# Patient Record
Sex: Male | Born: 1985 | Race: White | Hispanic: No | Marital: Single | Smoking: Current every day smoker
Health system: Southern US, Community
[De-identification: ages and names within clinical notes are randomized; demographics above are authoritative.]

## PROBLEM LIST (undated history)

## (undated) DIAGNOSIS — F1010 Alcohol abuse, uncomplicated: Secondary | ICD-10-CM

## (undated) HISTORY — PX: ROTATOR CUFF REPAIR: SHX139

---

## 2006-12-07 ENCOUNTER — Emergency Department (HOSPITAL_COMMUNITY): Admission: EM | Admit: 2006-12-07 | Discharge: 2006-12-08 | Payer: Self-pay | Admitting: Emergency Medicine

## 2008-06-29 ENCOUNTER — Emergency Department (HOSPITAL_BASED_OUTPATIENT_CLINIC_OR_DEPARTMENT_OTHER): Admission: EM | Admit: 2008-06-29 | Discharge: 2008-06-29 | Payer: Self-pay | Admitting: Emergency Medicine

## 2009-07-21 ENCOUNTER — Emergency Department (HOSPITAL_BASED_OUTPATIENT_CLINIC_OR_DEPARTMENT_OTHER)
Admission: EM | Admit: 2009-07-21 | Discharge: 2009-07-21 | Payer: Self-pay | Source: Home / Self Care | Admitting: Emergency Medicine

## 2011-06-01 ENCOUNTER — Encounter (HOSPITAL_COMMUNITY): Payer: Self-pay | Admitting: *Deleted

## 2011-06-01 ENCOUNTER — Emergency Department (HOSPITAL_COMMUNITY)
Admission: EM | Admit: 2011-06-01 | Discharge: 2011-06-02 | Disposition: A | Discharge status: Home / Self Care | Payer: BC Managed Care – PPO | Attending: Emergency Medicine | Admitting: Emergency Medicine

## 2011-06-01 DIAGNOSIS — F17200 Nicotine dependence, unspecified, uncomplicated: Secondary | ICD-10-CM | POA: Insufficient documentation

## 2011-06-01 DIAGNOSIS — F10939 Alcohol use, unspecified with withdrawal, unspecified: Secondary | ICD-10-CM | POA: Insufficient documentation

## 2011-06-01 DIAGNOSIS — F10239 Alcohol dependence with withdrawal, unspecified: Secondary | ICD-10-CM | POA: Insufficient documentation

## 2011-06-01 DIAGNOSIS — F1010 Alcohol abuse, uncomplicated: Secondary | ICD-10-CM

## 2011-06-01 LAB — COMPREHENSIVE METABOLIC PANEL
Albumin: 4.2 g/dL (ref 3.5–5.2)
Alkaline Phosphatase: 94 U/L (ref 39–117)
CO2: 27 mEq/L (ref 19–32)
Calcium: 9.5 mg/dL (ref 8.4–10.5)
Chloride: 100 mEq/L (ref 96–112)
Creatinine, Ser: 0.72 mg/dL (ref 0.50–1.35)
GFR calc Af Amer: >90 mL/min (ref >90)
GFR calc non Af Amer: >90 mL/min (ref >90)
Potassium: 3.4 mEq/L — ABNORMAL LOW (ref 3.5–5.1)
Total Bilirubin: 1 mg/dL (ref 0.3–1.2)
Total Protein: 7.7 g/dL (ref 6.0–8.3)

## 2011-06-01 LAB — RAPID URINE DRUG SCREEN, HOSP PERFORMED
Amphetamines: NOT DETECTED
Barbiturates: NOT DETECTED
Opiates: NOT DETECTED

## 2011-06-01 LAB — ACETAMINOPHEN LEVEL: Acetaminophen (Tylenol), Serum: <15 ug/mL (ref 10–30)

## 2011-06-01 LAB — CBC
Hemoglobin: 14.3 g/dL (ref 13.0–17.0)
Platelets: 246 10*3/uL (ref 150–400)
WBC: 9 10*3/uL (ref 4.0–10.5)

## 2011-06-01 MED ORDER — VITAMIN B-1 100 MG PO TABS
100.0000 mg | ORAL_TABLET | Freq: Every day | ORAL | Status: DC
  Administered 2011-06-01 – 2011-06-02 (×2): 100 mg via ORAL
  Filled 2011-06-01 (×2): qty 1

## 2011-06-01 MED ORDER — LORAZEPAM 2 MG/ML IJ SOLN: 1.0000 mg | Freq: Four times a day (QID) | INTRAMUSCULAR | Status: DC | PRN

## 2011-06-01 MED ORDER — THIAMINE HCL 100 MG/ML IJ SOLN: 100.0000 mg | Freq: Every day | INTRAMUSCULAR | Status: DC

## 2011-06-01 MED ORDER — FOLIC ACID 1 MG PO TABS
1.0000 mg | ORAL_TABLET | Freq: Every day | ORAL | Status: DC
  Administered 2011-06-01 – 2011-06-02 (×2): 1 mg via ORAL
  Filled 2011-06-01 (×2): qty 1

## 2011-06-01 MED ORDER — ADULT MULTIVITAMIN W/MINERALS CH
1.0000 | ORAL_TABLET | Freq: Every day | ORAL | Status: DC
  Administered 2011-06-01 – 2011-06-02 (×2): 1 via ORAL
  Filled 2011-06-01 (×2): qty 1

## 2011-06-01 MED ORDER — LORAZEPAM 1 MG PO TABS: 1.0000 mg | ORAL_TABLET | Freq: Four times a day (QID) | ORAL | Status: DC | PRN

## 2011-06-01 NOTE — ED Provider Notes (Signed)
History     CSN: [id number]  Arrival date & time 06/01/11  1544   First MD Initiated Contact with Patient 06/01/11 1747      Chief Complaint  Patient presents with  . Medical Clearance    pt here for medical clearance, [facility]. reports [facility] with tactile hallucinations. pt here for detox from alcohol. reports last drink was 16 days ago. pt also reports "passing out 4 times since monday"    (Consider location/radiation/quality/duration/timing/severity/associated sxs/prior treatment) HPI Comments: Patient comes in today requesting alcohol detox.  He reports that his last drink was 16 days ago.  He had been drinking a fifth of vodka/day.  Patient and parents report that 2 days go the patient had a two separate seizures.    Seizures were witnessed by family.  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  Back in June of 2012 he was seen at the hospital in [place] and apparently had a CT head, MRI head, EEG, and was evaluated by Neurology.  He reports that it was determined that the seizures were caused by alcohol withdrawal.  He also reports that yesterday he began having visual and tactile hallucinations.  He began seeing worms crawling out of his mouth and reports that he could feels worms moving throughout his body.  He was seen at [facility] earlier today to begin alcohol rehab.  He apparently was told by them that they would not accept him because of the hallucinations that he had experienced.  He reports that he smokes marijuana three-four times a week, but denies any other elicit drug use.   Patient denies any suicidal or homicidal ideation.  Patient's psychiatrist is Dr. Luisa Hart.  Family reports that he has seen the psychiatrist in the past for anger outbursts.  He is currently on Seroquel.  Family also reports that last evening the patient was unable to sleep.  Family also  reports that a couple of weeks ago patient made comments that he "wanted to go be with Molly Maduro."  Family reports that Molly Maduro is a friend of the patient's who has died.  Patient denies current SI.  The history is provided by the patient and a parent.    History reviewed. No pertinent past medical history.  Past Surgical History  Procedure Date  . Rotator cuff repair     History reviewed. No pertinent family history.  History  Substance Use Topics  . Smoking status: Current Everyday Smoker    Types: Cigarettes  . Smokeless tobacco: Not on file  . Alcohol Use: Yes      Review of Systems  Constitutional: Negative for fever and chills.  Eyes: Negative for visual disturbance.  Gastrointestinal: Negative for nausea and vomiting.  Neurological: Negative for dizziness, syncope and headaches.  Psychiatric/Behavioral: Positive for hallucinations and sleep disturbance. Negative for suicidal ideas, confusion, self-injury and agitation. The patient is not nervous/anxious.     Allergies  Review of patient's allergies indicates no known allergies.  Home Medications  No current outpatient prescriptions on file.  BP 151/89  Pulse 86  Temp(Src) 98.8 F (37.1 C) (Oral)  Resp 20  SpO2 100%  Physical Exam  Nursing note and vitals reviewed. Constitutional: He is oriented to person, place, and time. He appears well-developed and well-nourished. No distress.  HENT:  Head: Normocephalic and atraumatic.  Eyes: EOM are normal. Pupils are equal, round, and reactive to light.  Neck: Normal range of motion. Neck supple.  Cardiovascular: Normal rate and normal heart sounds.   Pulmonary/Chest: Effort normal and breath sounds normal. No respiratory distress.  Musculoskeletal: Normal range of motion.  Neurological: He is alert and oriented to person, place, and time.  Skin: Skin is warm and dry. He is not diaphoretic.  Psychiatric: He has a normal mood and affect. His speech is normal and  behavior is normal. Thought content is not paranoid. Cognition and memory are normal. He expresses no homicidal and no suicidal ideation. He expresses no suicidal plans and no homicidal plans.    ED Course  Procedures (including critical care time)  Labs Reviewed  CBC - Abnormal; Notable for the following:    RBC 4.18 (*)    MCH 34.2 (*)    MCHC 36.1 (*)    All other components within normal limits  COMPREHENSIVE METABOLIC PANEL - Abnormal; Notable for the following:    Potassium 3.4 (*)    AST 50 (*)    ALT 57 (*)    All other components within normal limits  URINE RAPID DRUG SCREEN (HOSP PERFORMED) - Abnormal; Notable for the following:    Tetrahydrocannabinol POSITIVE (*)    All other components within normal limits  ETHANOL  ACETAMINOPHEN LEVEL   No results found.   No diagnosis found.  8:06 PM Discussed patient with Dr. Mahala Menghini who will see patient in the ED.  Discussed with Dr. Mahala Menghini after he evaluated patient.  Dr. Mahala Menghini reports that he does not feel that the patient meets criteria to be admitted from a medical stand point.  He recommends consulting psychiatry for further evaluation.     MDM          Magnus Sinning, PA-C 06/01/11 2345

## 2011-06-01 NOTE — ED Notes (Signed)
Pts belongings sent home with family.

## 2011-06-01 NOTE — ED Notes (Signed)
Pt placed in room. Pharmacy tech in to interview pt. Pt  Is calm. States he has been having seizures, and has not had a drink in 16 days. Pt states that he has been seeing "worms crawling out of my mouth but I know other people can't see them". Pt's parents are here with him.

## 2011-06-01 NOTE — ED Notes (Signed)
Pt was given sandwich and drink as requested.  NAD noted.

## 2011-06-01 NOTE — BH Assessment (Signed)
Assessment Note   Shane Macias is an 26 y.o. male. PT PRESENTS REQUESTING DETOX AFTER HE HAD [facility] & FELT HE SHOULD BE ADMITTED FOR DUAL DIAGNOSIS SINCE PT HAD DELUSIONS ON YESTERDAY & SEVERAL DAYS AGO. PT DENIES SUICIDAL OR HOMOCIDAL IDEATION OR ANY CURRENT HALLUCINATIONS. PT SEEMS CALM & IN A PLEASANT SPIRIT. PARENTS ARE VERY CONCERNED & WANT PT TO GET HELP. PT'S LABS WERE CLEARED [facility] HE TESTED POSITIVE FOR THC. PT STATES HE LAST DRINK WAS 1 WEEK AGO & HE EXPERIENCE 3 SEIZURES IN THE PAST  FEW MONTHS. PT WAS INFORMED HE DID NOT MEET CRITERIA FOR DETOX & MEDICAL TEAM HAS ALREADY CLEARED HIM FOR MEDICAL ISSUES. PT WILL BE [facility] & CLINICIAN HAS CALLED [facility] TO INFORM [facility] OF ASSESSMENT & DISPOSITION. EDP WAS ALSO INFORMED OF PLAN WHO ALSO AGREES.  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 No pertinent past medical history. Axis IV: problems related to social environment Axis V: 51-60 moderate symptoms  Past Medical History: History reviewed. No pertinent past medical history.  Past Surgical History  Procedure Date  . Rotator cuff repair     Family History: History reviewed. No pertinent family history.  Social History:  reports that he has been smoking Cigarettes.  He does not have any smokeless tobacco history on file. He reports that he drinks about 24.5 ounces of alcohol per week. He reports that he uses illicit drugs (Marijuana).  Additional Social History:    Allergies: No Known Allergies  Home Medications:  Medications Prior to Admission  Medication Dose Route Frequency Provider Last Rate Last Dose  . folic acid (FOLVITE) tablet 1 mg  1 mg Oral Daily Heather Van Wingen, PA-C      . LORazepam (ATIVAN) tablet 1 mg  1 mg Oral Q6H PRN Pascal Lux Wingen, PA-C       Or  . LORazepam  (ATIVAN) injection 1 mg  1 mg Intravenous Q6H PRN Pascal Lux Wingen, PA-C      . mulitivitamin with minerals tablet 1 tablet  1 tablet Oral Daily Heather Van Wingen, PA-C      . thiamine (VITAMIN B-1) tablet 100 mg  100 mg Oral Daily Heather Van Wingen, PA-C       Or  . thiamine (B-1) injection 100 mg  100 mg Intravenous Daily Pascal Lux Wingen, PA-C       No current outpatient prescriptions on file as of 06/01/2011.    OB/GYN Status:  No LMP for male patient.  General Assessment Data Location of Assessment: [facility] Assessment Services Living Arrangements: Alone Can pt return to current living arrangement?: Yes Admission Status: Voluntary Is patient capable of signing voluntary admission?: Yes Transfer from: Acute Hospital Referral Source: Self/Family/Friend     Risk to self Suicidal Ideation: No Suicidal Intent: No Is patient at risk for suicide?: No Suicidal Plan?: No Access to Means: No What has been your use of drugs/alcohol within the last 12 months?: ETOH STARTED AT 15 & DRANK 5TH VODKA DAILY & LAST DRINK WAS 05/23/11; THC STARTED AT AGE 86 & SMOKES 2-3 BLUNTS DAILY & LAST 05/29/11 Previous Attempts/Gestures: Yes How many times?: 0  Other Self Harm Risks: NA Triggers for Past Attempts: Unpredictable Intentional Self Injurious Behavior: None Family Suicide History: No Recent stressful life event(s): Other (Comment) Persecutory voices/beliefs?: No Depression: No Depression Symptoms:  Loss of interest in usual pleasures Substance abuse history and/or treatment for substance abuse?: Yes Suicide prevention information given to non-admitted patients: Yes  Risk to Others Homicidal Ideation: No Thoughts of Harm to Others: No Current Homicidal Intent: No Current Homicidal Plan: No Access to Homicidal Means: No Identified Victim: NA History of harm to others?: No Assessment of Violence: None Noted Violent Behavior Description: COOPERATIVE, PLEASANT Does patient have access  to weapons?: No Criminal Charges Pending?: No Does patient have a court date: No  Psychosis Hallucinations: None noted Delusions: None noted  Mental Status Report Appear/Hygiene: Improved Eye Contact: Good Motor Activity: Freedom of movement Speech: Rapid Level of Consciousness: Alert Mood: Other (Comment) (NORMAL) Affect: Other (Comment) (PLEASANT) Anxiety Level: None Thought Processes: Coherent;Relevant Judgement: Unimpaired Orientation: Person;Place;Time;Situation Obsessive Compulsive Thoughts/Behaviors: None  Cognitive Functioning Concentration: Decreased Memory: Recent Intact;Remote Intact IQ: Average Insight: Poor Impulse Control: Poor Appetite: Poor Weight Loss: 0  Weight Gain: 0  Sleep: No Change Total Hours of Sleep: 0  Vegetative Symptoms: None  Prior Inpatient Therapy Prior Inpatient Therapy: No Prior Therapy Dates: NA Prior Therapy Facilty/Provider(s): NA Reason for Treatment: NA  Prior Outpatient Therapy Prior Outpatient Therapy: Yes Prior Therapy Dates: 5 YRS AGO Prior Therapy Facilty/Provider(s): [facility] Reason for Treatment: [facility]            Values / Beliefs Cultural Requests During Hospitalization: None Spiritual Requests During Hospitalization: None        Additional Information 1:1 In Past 12 Months?: No CIRT Risk: No Elopement Risk: No Does patient have medical clearance?: No     Disposition:  Disposition Disposition of Patient: Other dispositions Other disposition(s): Referred to outside facility [facility])  On Site Evaluation by:   Reviewed with Physician:     Waldron Session 06/01/2011 9:22 PM

## 2011-06-01 NOTE — ED Notes (Signed)
Pt presents for medical clearance from [facility].  Pt was to begin 28 day program @ [facility].  Pt last drank 17 days ago.  Pt experienced hallucinations yesterday and has experienced sleeplessness.

## 2011-06-01 NOTE — ED Notes (Signed)
[facility]. Pt was at home when he started to go through dt's

## 2011-06-01 NOTE — ED Notes (Signed)
Natalia Leatherwood Act reports that she is working on the tele psych consult

## 2011-06-01 NOTE — ED Notes (Signed)
Mother left a contact number, pls call with any change... Lynden Ang Better [phone]

## 2011-06-01 NOTE — Consult Note (Signed)
PCP:   No primary provider on file.   Subspecialists involved  Chief Complaint:  26 yr old mal e, knonw to use ETOH, presnets from [facility] sent over here as he has had some issues.  Went to [place] for New Years and discontinued the ETOH then.  The first seizure on Jan 5th the Wednesday after he got back, and managed to burn himself and then went to the hospital at [facility] where he was Rx Seroquel.  The next day awake and had a seizure and seemed to have "gone crazy". First [employer] and knees buckled and he went limp and had tonic-clonic sz's and his eyes wer eclosed and he did this for a few , no loc of bowel or bladder, but did bite his tongue 2nd seizure-was at his appt.  Was on balcony and was on his knees and up against the railing but shaking and no response.  Stumbled and passed out over the rail Last seizure was one was a couple days ago and was yelling/screaming and saying random things-seemd somehwat conscious but walked to the top of the stairs and then he rolled and collapse dand fell all the way down the stairs and went to the basement and collapsed onto the stairs Hearing no voices. Smoked THC 2 night ago.  Yesterday morning felt vision was shakey" and white shadowed was walking around confused.  flet a weird tickle on his nose. thought there might have felt a worm or a small snake and could see it in the mirror, and there wa snothuing there either.  No delusions of granduer.    Currently no Sz like activity Review of Systems:  No cp/sob/blurrede or couble vision/no hjeadaches/no cough or cold/no runny nose/no fever/ no current seizure/ no falls no syncope  Past Medical History: History reviewed. No pertinent past medical history.  Past surgical history: Past Surgical History  Procedure Date  . Rotator cuff repair     Medications: Prior to Admission medications   Medication Sig Start Date End Date Taking? Authorizing Provider  cephALEXin (KEFLEX) 500 MG  capsule Take 500 mg by mouth 3 (three) times daily.   Yes Historical Provider, MD  Multiple Vitamin (MULITIVITAMIN WITH MINERALS) TABS Take 1 tablet by mouth daily.   Yes Historical Provider, MD  naproxen sodium (ANAPROX) 220 MG tablet Take 220 mg by mouth 2 (two) times daily with a meal.   Yes Historical Provider, MD  QUEtiapine (SEROQUEL) 25 MG tablet Take 25 mg by mouth at bedtime.   Yes Historical Provider, MD    Allergies:  No Known Allergies  Social History:  reports that he has been smoking Cigarettes.  He does not have any smokeless tobacco history on file. He reports that he drinks alcohol. He reports that he uses illicit drugs (Marijuana).  Family History: History reviewed. No pertinent family history.  Physical Exam: Filed Vitals:   06/01/11 1600  BP: 151/89  Pulse: 86  Temp: 98.8 F (37.1 C)  TempSrc: Oral  Resp: 20  SpO2: 100%    HEENT-aleet-hypervigilant but friendly.  Oriented x3  CHEST-cta b CARDS-s1 s2 no m/r/g ABD-soft/nt/nd SKIN-burn to L hand NEURO-no focal anomaly. No past-pointing, no cerebellar signs.  Gait normal.  Power 5/5.  Reflexes brisk.  No dolls sign  No nystagmus.  Labs on Admission:   [facility] 06/01/11 1704  NA 139  K 3.4*  CL 100  CO2 27  GLUCOSE 81  BUN 9  CREATININE 0.72  CALCIUM 9.5  MG --  PHOS --    Basename 06/01/11 1704  AST 50*  ALT 57*  ALKPHOS 94  BILITOT 1.0  PROT 7.7  ALBUMIN 4.2   No results found for this basename: LIPASE:2,AMYLASE:2 in the last 72 hours  Basename 06/01/11 1704  WBC 9.0  NEUTROABS --  HGB 14.3  HCT 39.6  MCV 94.7  PLT 246   No results found for this basename: CKTOTAL:3,CKMB:3,CKMBINDEX:3,TROPONINI:3 in the last 72 hours No results found for this basename: TSH,T4TOTAL,FREET3,T3FREE,THYROIDAB in the last 72 hours No results found for this basename: VITAMINB12:2,FOLATE:2,FERRITIN:2,TIBC:2,IRON:2,RETICCTPCT:2 in the last 72 hours  Radiological Exams on Admission: No results  found.  Assessment/Plan Patient is 26 years old with no prior cerebrovascular disease and has recently discontinued off heavy drinking alcohol about 25 ounces per week of liquor. I suspect he might have some Korsakoff syndrome versus other issue but it is unlikely that he is developing new neurological insult secondary to these issues. He has had a recent workup at [facility] emergency room with CT scan and EEG test not available to me at present time however at this point given his normal labs nonfocal clinical findings and no recurrent seizures at present time I feel it is safe that he can follow up as an outpatient physician/neurologist for further workup of these issues prior to getting the detox that he so desires. If needed he can possibly go home on Keppra 500 twice a day until he is seen by neurologist versus PCP and I have highly requested emergency room practitioner consult psychiatry to see if they can assist with placement. They can also ask neurology for a telephone consult versus an inpatient for consult but I do not believe he needs inpatient criteria at this time thanks for this  [facility] 06/01/2011, 8:03 PM

## 2011-06-01 NOTE — ED Notes (Signed)
PA at bedside.

## 2011-06-02 NOTE — ED Provider Notes (Signed)
History     CSN: [id number]  Arrival date & time 06/01/11  1544   First MD Initiated Contact with Patient 06/01/11 1747      Chief Complaint  Patient presents with  . Medical Clearance    pt here for medical clearance, [facility]. [facility] with tactile hallucinations. pt here for detox from alcohol. reports last drink was 16 days ago. pt also reports "passing out 4 times since monday"    (Consider location/radiation/quality/duration/timing/severity/associated sxs/prior treatment) HPI  History reviewed. No pertinent past medical history.  Past Surgical History  Procedure Date  . Rotator cuff repair     History reviewed. No pertinent family history.  History  Substance Use Topics  . Smoking status: Current Everyday Smoker    Types: Cigarettes  . Smokeless tobacco: Not on file  . Alcohol Use: 24.5 oz/week    49 drink(s) per week      Review of Systems  Allergies  Review of patient's allergies indicates no known allergies.  Home Medications   Current Outpatient Rx  Name Route Sig Dispense Refill  . CEPHALEXIN 500 MG PO CAPS Oral Take 500 mg by mouth 3 (three) times daily.    . ADULT MULTIVITAMIN W/MINERALS CH Oral Take 1 tablet by mouth daily.    Marland Kitchen NAPROXEN SODIUM 220 MG PO TABS Oral Take 220 mg by mouth 2 (two) times daily with a meal.    . QUETIAPINE FUMARATE 25 MG PO TABS Oral Take 25 mg by mouth at bedtime.      BP 127/82  Pulse 65  Temp(Src) 98 F (36.7 C) (Oral)  Resp 20  SpO2 96%  Physical Exam  ED Course  Procedures (including critical care time)   Patient evaluated. He is having no active hallucinations. He admits to having visual hallucinations greater than 24 hours ago now lasting about an hour.  [facility] and they sent him here for medical clearance. He had a telemetry psych evaluation performed psychiatrist Dr. Trisha Mangle  evaluated patient for possible psychosis. He states in his diagnosis: Alcohol abuse. His recommendations are that patient is psychiatrically stable to participate in a substance abuse rehabilitation program, that patient does not appear to be a danger to self or others, the patient can be discharged if medically clear, and the patient is advised not to use drugs or alcohol. He recommended stopping Seroquel and  starting Benadryl as needed for sleep. Patient is medically cleared. He's been evaluated by Hospitalist who also feel she is medically cleared and does not require admission. Per the plan by Dr. Radford Pax, I consult medicine again after telemetry psych consult as above. Case discussed with Dr.Bonno reviewed patient's chart and chills patient absolutely does not require any admission. He has no active hallucinations. His last alcohol was 10 days ago and he is clearly detoxed. In this time he has had no seizure activity. He has already had a seizure workup including CT scan and MRI reported as normal. I had a very long discussion with his parents who will be here in the morning to pick him up and attempt to get him into rehabilitation. I also consulted [facility] again he stated [facility] not [facility] patient do to brief period of hallucinations even though there now resolved and even know he has been cleared by psychiatry, ED physicians, and 2 [facility].  Sunnie Nielsen, MD 06/02/11 [phone]

## 2011-06-02 NOTE — ED Notes (Signed)
Fax receipt confirmed w/  [facility] at [facility].

## 2011-06-02 NOTE — ED Notes (Addendum)
PT DC'D W/ WRITTEN INSTRUCTIONS,  PT'S   LABS, VS, MD NOTE AND TELEPSYCH, ACT assessment FAXED TO [facility].  PT TO GO Monday FOR ADMISSION PER DAD.  PT ENCOURAGED TO RETURN FOR ANY CONCERNS.

## 2011-06-02 NOTE — ED Notes (Signed)
Pt awake out to the desk was given water per request

## 2011-06-02 NOTE — ED Notes (Signed)
CSW faxed requested clearance information to [facility] per request of [facility] [address] staff member [place].

## 2011-06-02 NOTE — ED Notes (Signed)
Family (mother and father) states that they will be here in the morning.

## 2011-06-02 NOTE — ED Notes (Addendum)
Pt's dad here to pick him up

## 2011-06-02 NOTE — ED Notes (Signed)
Faxed recommendations from Tele Psych given to EDP Dierdre Highman

## 2011-06-03 NOTE — ED Provider Notes (Signed)
Medical screening examination/treatment/procedure(s) were performed by non-physician practitioner and as supervising physician I was immediately available for consultation/collaboration.    Nelia Shi, MD 06/03/11 2249

## 2019-02-08 ENCOUNTER — Other Ambulatory Visit: Payer: Self-pay

## 2019-02-08 ENCOUNTER — Emergency Department (HOSPITAL_COMMUNITY)
Admission: EM | Admit: 2019-02-08 | Discharge: 2019-02-09 | Disposition: A | Discharge status: Home / Self Care | Payer: BLUE CROSS/BLUE SHIELD | Attending: Emergency Medicine | Admitting: Emergency Medicine

## 2019-02-08 DIAGNOSIS — F17210 Nicotine dependence, cigarettes, uncomplicated: Secondary | ICD-10-CM | POA: Diagnosis not present

## 2019-02-08 DIAGNOSIS — F1310 Sedative, hypnotic or anxiolytic abuse, uncomplicated: Secondary | ICD-10-CM | POA: Diagnosis not present

## 2019-02-08 DIAGNOSIS — F1010 Alcohol abuse, uncomplicated: Secondary | ICD-10-CM | POA: Diagnosis present

## 2019-02-08 DIAGNOSIS — Z79899 Other long term (current) drug therapy: Secondary | ICD-10-CM | POA: Insufficient documentation

## 2019-02-08 DIAGNOSIS — F1220 Cannabis dependence, uncomplicated: Secondary | ICD-10-CM | POA: Insufficient documentation

## 2019-02-08 DIAGNOSIS — F1020 Alcohol dependence, uncomplicated: Secondary | ICD-10-CM | POA: Diagnosis not present

## 2019-02-08 DIAGNOSIS — F1420 Cocaine dependence, uncomplicated: Secondary | ICD-10-CM | POA: Diagnosis not present

## 2019-02-08 LAB — ETHANOL: Alcohol, Ethyl (B): 286 mg/dL — ABNORMAL HIGH (ref <10)

## 2019-02-08 LAB — COMPREHENSIVE METABOLIC PANEL
ALT: 57 U/L — ABNORMAL HIGH (ref 0–44)
AST: 44 U/L — ABNORMAL HIGH (ref 15–41)
Albumin: 4.3 g/dL (ref 3.5–5.0)
Alkaline Phosphatase: 121 U/L (ref 38–126)
Anion gap: 12 (ref 5–15)
BUN: 10 mg/dL (ref 6–20)
CO2: 27 mmol/L (ref 22–32)
Calcium: 8.4 mg/dL — ABNORMAL LOW (ref 8.9–10.3)
Chloride: 105 mmol/L (ref 98–111)
Creatinine, Ser: 0.78 mg/dL (ref 0.61–1.24)
GFR calc Af Amer: >60 mL/min (ref >60)
GFR calc non Af Amer: >60 mL/min (ref >60)
Glucose, Bld: 96 mg/dL (ref 70–99)
Potassium: 3.3 mmol/L — ABNORMAL LOW (ref 3.5–5.1)
Sodium: 144 mmol/L (ref 135–145)
Total Bilirubin: 0.6 mg/dL (ref 0.3–1.2)
Total Protein: 7.5 g/dL (ref 6.5–8.1)

## 2019-02-08 LAB — CBC
HCT: 45.9 % (ref 39.0–52.0)
Hemoglobin: 15.4 g/dL (ref 13.0–17.0)
MCH: 28.8 pg (ref 26.0–34.0)
MCHC: 33.6 g/dL (ref 30.0–36.0)
MCV: 86 fL (ref 80.0–100.0)
Platelets: 259 10*3/uL (ref 150–400)
RBC: 5.34 MIL/uL (ref 4.22–5.81)
RDW: 15.4 % (ref 11.5–15.5)
WBC: 6.4 10*3/uL (ref 4.0–10.5)
nRBC: 0 % (ref 0.0–0.2)

## 2019-02-08 LAB — RAPID URINE DRUG SCREEN, HOSP PERFORMED
Amphetamines: NOT DETECTED
Barbiturates: NOT DETECTED
Benzodiazepines: POSITIVE — AB
Cocaine: POSITIVE — AB
Opiates: NOT DETECTED
Tetrahydrocannabinol: POSITIVE — AB

## 2019-02-08 LAB — SALICYLATE LEVEL: Salicylate Lvl: <7 mg/dL (ref 2.8–30.0)

## 2019-02-08 LAB — ACETAMINOPHEN LEVEL: Acetaminophen (Tylenol), Serum: <10 ug/mL — ABNORMAL LOW (ref 10–30)

## 2019-02-08 LAB — CK: Total CK: 380 U/L (ref 49–397)

## 2019-02-08 MED ORDER — THIAMINE HCL 100 MG/ML IJ SOLN
100.00 | INTRAMUSCULAR | Status: DC
Start: 2019-02-07 — End: 2019-02-08

## 2019-02-08 MED ORDER — LORAZEPAM 1 MG PO TABS
0.0000 mg | ORAL_TABLET | Freq: Four times a day (QID) | ORAL | Status: DC
  Administered 2019-02-08: 1 mg via ORAL
  Filled 2019-02-08: qty 1

## 2019-02-08 MED ORDER — NICOTINE 21 MG/24HR TD PT24
1.00 | MEDICATED_PATCH | TRANSDERMAL | Status: DC
Start: ? — End: 2019-02-08

## 2019-02-08 MED ORDER — LORAZEPAM 2 MG/ML IJ SOLN
0.0000 mg | Freq: Four times a day (QID) | INTRAMUSCULAR | Status: DC
  Filled 2019-02-08: qty 1

## 2019-02-08 MED ORDER — LORAZEPAM 2 MG/ML IJ SOLN
2.00 | INTRAMUSCULAR | Status: DC
Start: ? — End: 2019-02-08

## 2019-02-08 MED ORDER — NICOTINE 21 MG/24HR TD PT24
21.0000 mg | MEDICATED_PATCH | Freq: Once | TRANSDERMAL | Status: DC
  Administered 2019-02-08: 16:00:00 21 mg via TRANSDERMAL
  Filled 2019-02-08: qty 1

## 2019-02-08 MED ORDER — THIAMINE HCL 100 MG/ML IJ SOLN: 100.0000 mg | Freq: Every day | INTRAMUSCULAR | Status: DC

## 2019-02-08 MED ORDER — LORAZEPAM 2 MG/ML IJ SOLN: 0.0000 mg | Freq: Two times a day (BID) | INTRAMUSCULAR | Status: DC

## 2019-02-08 MED ORDER — VITAMIN B-1 100 MG PO TABS: 100.0000 mg | ORAL_TABLET | Freq: Every day | ORAL | Status: DC

## 2019-02-08 MED ORDER — POTASSIUM CHLORIDE CRYS ER 20 MEQ PO TBCR: 40.0000 meq | EXTENDED_RELEASE_TABLET | Freq: Once | ORAL | Status: DC

## 2019-02-08 MED ORDER — LORAZEPAM 2 MG/ML IJ SOLN: 2.0000 mg | INTRAMUSCULAR | Status: DC | PRN

## 2019-02-08 MED ORDER — SODIUM CHLORIDE 0.9 % IV BOLUS
1000.0000 mL | Freq: Once | INTRAVENOUS | Status: AC
  Administered 2019-02-08: 13:00:00 1000 mL via INTRAVENOUS

## 2019-02-08 MED ORDER — LORAZEPAM 1 MG PO TABS: 0.0000 mg | ORAL_TABLET | Freq: Two times a day (BID) | ORAL | Status: DC

## 2019-02-08 NOTE — Progress Notes (Signed)
TTS attempting to call ED to advise of disposition, no answer. Will try to call back again.  Lind Covert, MSW, LCSW Therapeutic Triage Specialist  [phone]

## 2019-02-08 NOTE — Progress Notes (Signed)
Per Lindon Romp, NP pt does not meet inpt criteria and recommends a peer support consult. Recommends IVC be rescinded.   Lind Covert, MSW, LCSW Therapeutic Triage Specialist  [phone]

## 2019-02-08 NOTE — ED Notes (Signed)
Alcohol noted in pt property when placing it in patient belonging cabinet 5-8 at nurses station. Manuela Schwartz RN charge aware. 1 four loko beer and 2 air plane bottles of liquor emptied and flushed, security present and witnessed.

## 2019-02-08 NOTE — Progress Notes (Signed)
TTS attempted to contact ED to see if pt is alert enough to participate in the assessment, was placed on hold by nurse secretary and no one ever picked up after being on hold for more than 10 mins. Will try again later.  Lind Covert, MSW, LCSW Therapeutic Triage Specialist  [phone]

## 2019-02-08 NOTE — ED Notes (Signed)
Secuirty and [employer] at bedside, pt not able to follow simple directions, pulling off all medical equipment, not open to redirection from staff, continues to try to leave hospital. ED MD and PA aware, IVC initiated by MD. Will continue to monitor.

## 2019-02-08 NOTE — Progress Notes (Signed)
TTS spoke with Carlisle Cater, PA-C to advise of recommendation.   Lind Covert, MSW, LCSW Therapeutic Triage Specialist  [phone]

## 2019-02-08 NOTE — ED Provider Notes (Signed)
Signout from [facility] at shift change.  Patient under IVC due to alcohol intoxication and trying to leave.  TTS evaluation completed.  They recommend outpatient resources with peer consult.  This was placed.  IVC rescinded by Dr. Sherry Ruffing.  BP (!) 167/106   Pulse 98   Temp (!) 97.1 F (36.2 C) (Rectal)   Resp (!) 24   Ht 6' (1.829 m)   Wt 99.8 kg   SpO2 100%   BMI 29.84 kg/m     Carlisle Cater, PA-C 02/08/19 2348    Tegeler, Gwenyth Allegra, MD 02/08/19 2350

## 2019-02-08 NOTE — ED Provider Notes (Signed)
[facility] DEPT Provider Note   CSN: [id number] Arrival date & time: 02/08/19  1136     History   Chief Complaint No chief complaint on file.   HPI Shane Macias is a 33 y.o. male.     HPI   33 year old male presents after being found in a field.  Per patient he does admit to taking Xanax and drinking two 40s of alcohol.  Patient is unsure how long he was in the fields.  Originally he said 2 days and that he for 3 to 4 minutes.  Patient alert and oriented to person and time.  He denies any abdominal pain, chest pain, shortness of breath, vomiting.  No past medical history on file.  There are no active problems to display for this patient.   Past Surgical History:  Procedure Laterality Date  . ROTATOR CUFF REPAIR          Home Medications    Prior to Admission medications   Medication Sig Start Date End Date Taking? Authorizing Provider  cephALEXin (KEFLEX) 500 MG capsule Take 500 mg by mouth 3 (three) times daily.    [provider]  Multiple Vitamin (MULITIVITAMIN WITH MINERALS) TABS Take 1 tablet by mouth daily.    [provider]  naproxen sodium (ANAPROX) 220 MG tablet Take 220 mg by mouth 2 (two) times daily with a meal.    [provider]  QUEtiapine (SEROQUEL) 25 MG tablet Take 25 mg by mouth at bedtime.    [provider]    Family History No family history on file.  Social History Social History   Tobacco Use  . Smoking status: Current Every Day Smoker    Types: Cigarettes  Substance Use Topics  . Alcohol use: Yes    Alcohol/week: 49.0 standard drinks    Types: 49 drink(s) per week  . Drug use: Yes    Types: Marijuana    Comment: did Mushrroms 12 yrs ago, tried cocaine, xanax,pain killer     Allergies   Patient has no known allergies.   Review of Systems Review of Systems  Constitutional: Negative for chills and fever.  Respiratory: Negative for shortness of breath.    Cardiovascular: Negative for chest pain.  Gastrointestinal: Negative for abdominal pain, nausea and vomiting.     Physical Exam Updated Vital Signs Ht 6' (1.829 m)   Wt 99.8 kg   BMI 29.84 kg/m   Physical Exam Vitals signs and nursing note reviewed.  Constitutional:      Appearance: He is well-developed.     Comments: Clearly intoxicated  HENT:     Head: Normocephalic and atraumatic.  Eyes:     Extraocular Movements:     Right eye: Nystagmus present.     Left eye: Nystagmus present.     Conjunctiva/sclera: Conjunctivae normal.  Neck:     Musculoskeletal: Neck supple.  Cardiovascular:     Rate and Rhythm: Normal rate and regular rhythm.     Heart sounds: Normal heart sounds. No murmur.  Pulmonary:     Effort: Pulmonary effort is normal. No respiratory distress.     Breath sounds: Normal breath sounds. No wheezing or rales.  Abdominal:     General: Bowel sounds are normal. There is no distension.     Palpations: Abdomen is soft.     Tenderness: There is no abdominal tenderness.  Musculoskeletal: Normal range of motion.        General: No tenderness or deformity.  Skin:  General: Skin is warm and dry.     Findings: No erythema or rash.  Neurological:     Mental Status: He is alert and oriented to person, place, and time.  Psychiatric:        Behavior: Behavior normal.      ED Treatments / Results  Labs (all labs ordered are listed, but only abnormal results are displayed) Labs Reviewed  CBC  COMPREHENSIVE METABOLIC PANEL  ETHANOL  ACETAMINOPHEN LEVEL  RAPID URINE DRUG SCREEN, HOSP PERFORMED  SALICYLATE LEVEL  CK    EKG None  Radiology No results found.  Procedures Procedures (including critical care time)  Medications Ordered in ED Medications - No data to display   Initial Impression / Assessment and Plan / ED Course  I have reviewed the triage vital signs and the nursing notes.  Pertinent labs & imaging results that were available during  my care of the patient were reviewed by me and considered in my medical decision making (see chart for details).         1545: Patient became very agitated and wanted to leave.  He is not clinically sober, has an unsteady gait.  Attempted to rationalize with the patient.  He was encouraged to rest, eat and drink something.  He was offered to call someone to come get him to take him home.  However he was unable to find a ride home.  He continued to escalate, trying to leave the room, trying to pull out his IV.  Discussed case with attending, Dr. Denton Lank.  Believe patient is a danger to himself due to him being severely intoxicated.  He fell while in the room, was witnessed by the tech and no injuries obtained.  Discussed with attending and IVC paperwork initiated.  Order for restraints put in as needed for violence.   1700: Patient resting comfortably in bed, no acute distress, nontoxic, non-lethargic.  Vital signs stable.  We will continue to observe the patient.  He is medically cleared at this time for psychiatric evaluation.   2000: Patient continues to be resting comfortably in bed, no acute distress, nontoxic, non-lethargic.  He is sleeping at this time.  He has not received TTS evaluation as of yet.  Patient needs to continue to sober up.  He then can have a reevaluation.  If he is clinically sober and denies any SI or HI, he can likely have his IVC rescinded.  Final Clinical Impressions(s) / ED Diagnoses   Final diagnoses:  None    ED Discharge Orders    None       Rueben Bash 02/08/19 2009    Cathren Laine, MD 02/09/19 [phone]

## 2019-02-08 NOTE — ED Notes (Signed)
CIWA assessment deferred pt currently sleeping.  °

## 2019-02-08 NOTE — BH Assessment (Addendum)
Tele Assessment Note   Patient Name: Shane Macias MRN: [id number] Referring Physician: Rueben Bash Location of Patient: [facility] Location of Provider: Behavioral Health TTS Department  [place]uka Churchill is an 33 y.o. male who presents to the ED under IVC initiated by the EDP while in the ED. Pt reports he came to the ED because he wants to detox from alcohol. Pt was reportedly found in a field today unresponsive after consuming large amounts of alcohol. Pt BAL 286 on arrival to ED. Pt admits he also used xanax today but states that is rare. Pt minimizes his substance abuse during the assessment. Pt labs also positive for cannabis and cocaine however he only admits to using alcohol and xanax. Pt states he has received SA tx in the past 1 year ago. Pt states he is currently unemployed and lives with his family.  Pt denies SI, HI, and AVH. Pt states he has been admitted to inpt facilities in the past due to depression and SA. Pt states he receives support from his children, his girlfriend, and his parents. Pt states he does not want his family contacted because he does not want to burden them with his substance abuse issues.   Per Nira Conn, NP pt does not meet inpt criteria and recommends a peer support consult due to substance abuse issues. Recommends IVC be rescinded.   Diagnosis: Alcohol use d/o, severe; Cocaine use d/o, severe; Cannabis use d/o, severe  Past Medical History: No past medical history on file.  Past Surgical History:  Procedure Laterality Date  . ROTATOR CUFF REPAIR      Family History: No family history on file.  Social History:  reports that he has been smoking cigarettes. He does not have any smokeless tobacco history on file. He reports current alcohol use of about 49.0 standard drinks of alcohol per week. He reports current drug use. Drug: Marijuana.  Additional Social History:  Alcohol / Drug Use Pain Medications: See MAR Prescriptions: See MAR Over the  Counter: See MAR History of alcohol / drug use?: Yes Longest period of sobriety (when/how long): 2 years Negative Consequences of Use: Personal relationships Withdrawal Symptoms: Patient aware of relationship between substance abuse and physical/medical complications, Seizures Onset of Seizures: unknown Date of most recent seizure: 1 year ago Substance #1 Name of Substance 1: Alcohol 1 - Age of First Use: teens 1 - Amount (size/oz): excessive 1 - Frequency: daily 1 - Duration: ongoing 1 - Last Use / Amount: 02/08/19  CIWA: CIWA-Ar BP: (!) 167/106 Pulse Rate: 98 Nausea and Vomiting: no nausea and no vomiting Tactile Disturbances: mild itching, pins and needles, burning or numbness Tremor: not visible, but can be felt fingertip to fingertip Auditory Disturbances: not present Paroxysmal Sweats: barely perceptible sweating, palms moist Visual Disturbances: not present Anxiety: two Headache, Fullness in Head: none present Agitation: normal activity Orientation and Clouding of Sensorium: oriented and can do serial additions CIWA-Ar Total: 6 COWS:    Allergies: No Known Allergies  Home Medications: (Not in a hospital admission)   OB/GYN Status:  No LMP for male patient.  General Assessment Data Assessment unable to be completed: Yes Reason for not completing assessment: TTS attempted to contact ED to see if pt is alert enough to participate in the assessment, was placed on hold by nurse secretary and no one ever picked up after being on hold for more than 10 mins. Will try again later. Location of Assessment: [facility] TTS Assessment: In system Is this  a Tele or Face-to-Face Assessment?: Tele Assessment Is this an Initial Assessment or a Re-assessment for this encounter?: Initial Assessment Patient Accompanied by:: N/A Language Other than English: No Living Arrangements: Other (Comment) What gender do you identify as?: Male Marital status: Single Pregnancy Status: No Living  Arrangements: Other relatives Can pt return to current living arrangement?: Yes Admission Status: Involuntary Petitioner: ED Attending Is patient capable of signing voluntary admission?: No Referral Source: Self/Family/Friend Insurance type: none     Crisis Care Plan Living Arrangements: Other relatives Name of Psychiatrist: none Name of Therapist: none  Education Status Is patient currently in school?: No Is the patient employed, unemployed or receiving disability?: Unemployed  Risk to self with the past 6 months Suicidal Ideation: No Has patient been a risk to self within the past 6 months prior to admission? : Yes(found unresponsive in field) Suicidal Intent: No Has patient had any suicidal intent within the past 6 months prior to admission? : No Is patient at risk for suicide?: No Suicidal Plan?: No Has patient had any suicidal plan within the past 6 months prior to admission? : No Access to Means: No What has been your use of drugs/alcohol within the last 12 months?: alcohol, xanax Previous Attempts/Gestures: No Triggers for Past Attempts: None known Intentional Self Injurious Behavior: None Family Suicide History: No Recent stressful life event(s): Other (Comment)(alcohol abuse) Persecutory voices/beliefs?: No Depression: No Substance abuse history and/or treatment for substance abuse?: Yes Suicide prevention information given to non-admitted patients: Not applicable  Risk to Others within the past 6 months Homicidal Ideation: No Does patient have any lifetime risk of violence toward others beyond the six months prior to admission? : No Thoughts of Harm to Others: No Current Homicidal Intent: No Current Homicidal Plan: No Access to Homicidal Means: No History of harm to others?: No Assessment of Violence: None Noted Does patient have access to weapons?: No Criminal Charges Pending?: No Does patient have a court date: No Is patient on probation?:  No  Psychosis Hallucinations: None noted Delusions: None noted  Mental Status Report Appearance/Hygiene: Unremarkable Eye Contact: Good Motor Activity: Freedom of movement Speech: Logical/coherent Level of Consciousness: Alert Mood: Euthymic Affect: Appropriate to circumstance Anxiety Level: None Thought Processes: Relevant, Coherent Judgement: Partial Orientation: Person, Place, Time, Situation, Appropriate for developmental age Obsessive Compulsive Thoughts/Behaviors: None  Cognitive Functioning Concentration: Normal Memory: Remote Intact, Recent Intact Is patient IDD: No Insight: Good Impulse Control: Fair Appetite: Good Have you had any weight changes? : No Change Sleep: Decreased Total Hours of Sleep: 5 Vegetative Symptoms: None  ADLScreening [facility] Assessment Services) Patient's cognitive ability adequate to safely complete daily activities?: Yes Patient able to express need for assistance with ADLs?: Yes Independently performs ADLs?: Yes (appropriate for developmental age)  Prior Inpatient Therapy Prior Inpatient Therapy: Yes Prior Therapy Dates: 2016 Prior Therapy Facilty/Provider(s): [facility] Reason for Treatment: Depression, Substance abuse  Prior Outpatient Therapy Prior Outpatient Therapy: No Does patient have an ACCT team?: No Does patient have Intensive In-House Services?  : No Does patient have [facility] services? : No Does patient have P4CC services?: No  ADL Screening (condition at time of admission) Patient's cognitive ability adequate to safely complete daily activities?: Yes Is the patient deaf or have difficulty hearing?: No Does the patient have difficulty seeing, even when wearing glasses/contacts?: No Does the patient have difficulty concentrating, remembering, or making decisions?: No Patient able to express need for assistance with ADLs?: Yes Does the patient have difficulty dressing or bathing?: No  Independently performs ADLs?: Yes  (appropriate for developmental age) Does the patient have difficulty walking or climbing stairs?: No Weakness of Legs: None Weakness of Arms/Hands: None  Home Assistive Devices/Equipment Home Assistive Devices/Equipment: None    Abuse/Neglect Assessment (Assessment to be complete while patient is alone) Abuse/Neglect Assessment Can Be Completed: Yes Physical Abuse: Denies Verbal Abuse: Denies Sexual Abuse: Denies Exploitation of patient/patient's resources: Denies Self-Neglect: Denies     Regulatory affairs officer (For Healthcare) Does Patient Have a Medical Advance Directive?: No Would patient like information on creating a medical advance directive?: No - Patient declined          Disposition: Per Lindon Romp, NP pt does not meet inpt criteria and recommends a peer support consult due to substance abuse issues. Recommends IVC be rescinded.  Disposition Initial Assessment Completed for this Encounter: Yes  This service was provided via telemedicine using a 2-way, interactive audio and video technology.  Names of all persons participating in this telemedicine service and their role in this encounter. Name:  Nakai Pollio Role: Patient  Name: Lind Covert Role: TTS          Lyanne Co 02/08/2019 11:18 PM

## 2019-02-08 NOTE — ED Triage Notes (Signed)
Pt to ED via EMS , pt was found in a field, per pt girlfriend- pt may have used alcohol , xanax, cocaine. Pt A&O X 2- oriented to self and event- pt drowsy. #18 LAC- 500ML NS bolus given by EMS-

## 2019-02-09 ENCOUNTER — Emergency Department (HOSPITAL_COMMUNITY)
Admission: EM | Admit: 2019-02-09 | Discharge: 2019-02-09 | Disposition: A | Discharge status: Home / Self Care | Payer: BLUE CROSS/BLUE SHIELD | Source: Home / Self Care | Attending: Emergency Medicine | Admitting: Emergency Medicine

## 2019-02-09 ENCOUNTER — Other Ambulatory Visit: Payer: Self-pay

## 2019-02-09 ENCOUNTER — Encounter (HOSPITAL_COMMUNITY): Payer: Self-pay | Admitting: Obstetrics and Gynecology

## 2019-02-09 DIAGNOSIS — F1010 Alcohol abuse, uncomplicated: Secondary | ICD-10-CM | POA: Insufficient documentation

## 2019-02-09 HISTORY — DX: Alcohol abuse, uncomplicated: F10.10

## 2019-02-09 LAB — ETHANOL: Alcohol, Ethyl (B): <10 mg/dL (ref <10)

## 2019-02-09 NOTE — ED Notes (Signed)
Pt has eaten [place] sandwich, apple sauce, cheese, saline crackers, and water.

## 2019-02-09 NOTE — Discharge Instructions (Addendum)
There is information about inpatient and outpatient programs for alcohol detox. I have consulted with your support team, who will reach out to you to help navigate your alcohol detox. Make sure you are staying well-hydrated water. Return to the emergency room with any new, worsening, concerning symptoms.

## 2019-02-09 NOTE — ED Provider Notes (Signed)
[facility]-EMERGENCY DEPT Provider Note   CSN: [id number] Arrival date & time: 02/09/19  0157     History   Chief Complaint Chief Complaint  Patient presents with  . Alcohol Problem    HPI Shane Macias is a 33 y.o. male presenting with request for detox.  Patient states he is hoping he can detox in the hospital.  Patient states he is concerned he is going into withdrawals because he feels shaky.  Patient states he has no vertigo, and feels he needs help.  He has not attempted to contact any of the services that were provided to him in his paperwork upon discharge several hours ago.  He denies nausea, vomiting, or delirium.  He has not had anything to drink since he was discharged several hours ago.  Additional history obtained from chart review.  Patient has been seen multiple times (5) at multiple different hospitals in the past several weeks for alcohol detox.  Frequently leaves after receiving benzos or after becoming argumentative/combative.      HPI  Past Medical History:  Diagnosis Date  . Alcohol abuse     There are no active problems to display for this patient.   Past Surgical History:  Procedure Laterality Date  . ROTATOR CUFF REPAIR          Home Medications    Prior to Admission medications   Medication Sig Start Date End Date Taking? Authorizing Provider  cephALEXin (KEFLEX) 500 MG capsule Take 500 mg by mouth 3 (three) times daily.    [provider]  Multiple Vitamin (MULITIVITAMIN WITH MINERALS) TABS Take 1 tablet by mouth daily.    [provider]  naproxen sodium (ANAPROX) 220 MG tablet Take 220 mg by mouth 2 (two) times daily with a meal.    [provider]  QUEtiapine (SEROQUEL) 25 MG tablet Take 25 mg by mouth at bedtime.    [provider]    Family History No family history on file.  Social History Social History   Tobacco Use  . Smoking status: Current Every Day Smoker   Types: Cigarettes  Substance Use Topics  . Alcohol use: Yes    Alcohol/week: 49.0 standard drinks    Types: 49 Standard drinks or equivalent per week  . Drug use: Yes    Types: Marijuana    Comment: did Mushrroms 12 yrs ago, tried cocaine, xanax,pain killer     Allergies   Patient has no known allergies.   Review of Systems Review of Systems  Gastrointestinal: Negative for nausea and vomiting.  Neurological:       Feels shaky     Physical Exam Updated Vital Signs BP (!) 134/108 (BP Location: Right Arm)   Pulse 100   Temp 98.6 F (37 C) (Oral)   Resp 17   Ht 6' (1.829 m)   Wt 99.8 kg   SpO2 100%   BMI 29.84 kg/m   Physical Exam Vitals signs and nursing note reviewed.  Constitutional:      General: He is not in acute distress.    Appearance: He is well-developed.     Comments: Sitting comfortably in the bed in no acute distress  HENT:     Head: Normocephalic and atraumatic.  Neck:     Musculoskeletal: Normal range of motion.  Cardiovascular:     Rate and Rhythm: Regular rhythm. Tachycardia present.     Pulses: Normal pulses.     Comments: Tachy at 105 Pulmonary:  Effort: Pulmonary effort is normal.  Abdominal:     General: There is no distension.     Palpations: Abdomen is soft. There is no mass.     Tenderness: There is no abdominal tenderness. There is no guarding or rebound.  Musculoskeletal: Normal range of motion.     Comments: Mild tremor noted when pt holds arms out, otherwise no tremor noted.  Skin:    General: Skin is warm.     Findings: No rash.  Neurological:     Mental Status: He is alert and oriented to person, place, and time.      ED Treatments / Results  Labs (all labs ordered are listed, but only abnormal results are displayed) Labs Reviewed  ETHANOL    EKG None  Radiology No results found.  Procedures Procedures (including critical care time)  Medications Ordered in ED Medications - No data to display   Initial  Impression / Assessment and Plan / ED Course  I have reviewed the triage vital signs and the nursing notes.  Pertinent labs & imaging results that were available during my care of the patient were reviewed by me and considered in my medical decision making (see chart for details).        Patient presenting for alcohol detox.  On exam, patient is not severely withdrawing, CIWA score of 4. He is eating and drinking without difficulty. He has been seen multiple times recently for the same. At this time, I do not believe he needs further emergency care of hospitalization, especially as his complaint was that he has no where else to go. I believe he is looking for a place to stay. Outpatient resources given, peer support consult ordered. At this time, pt appears safe for d/c. Return precautions given. Pt states he understands and agrees to plan.   Final Clinical Impressions(s) / ED Diagnoses   Final diagnoses:  Alcohol abuse    ED Discharge Orders         Ordered    Consult to Peer Support    Provider:  (Not yet assigned)   02/09/19 [place], Ayisha Pol, PA-C 02/09/19 3300    Palumbo, April, MD 02/09/19 [id number]

## 2019-02-09 NOTE — ED Triage Notes (Signed)
Pt is sitting in lobby after being discharged about 3 hours ago. Rn asked patient why he came back, patient reports "it is just too much", when asked to explain, patient stated "The Detox" Patient denies drinking since he was discharged. Patient is eating gummi's and drinking a coke after he walked to [employer].  Patient does not have a ride home.

## 2019-05-23 ENCOUNTER — Emergency Department (HOSPITAL_COMMUNITY): Payer: BLUE CROSS/BLUE SHIELD

## 2019-05-23 ENCOUNTER — Emergency Department (HOSPITAL_COMMUNITY)
Admission: EM | Admit: 2019-05-23 | Discharge: 2019-05-24 | Disposition: A | Discharge status: Home / Self Care | Payer: BLUE CROSS/BLUE SHIELD | Attending: Emergency Medicine | Admitting: Emergency Medicine

## 2019-05-23 DIAGNOSIS — S0031XA Abrasion of nose, initial encounter: Secondary | ICD-10-CM | POA: Diagnosis not present

## 2019-05-23 DIAGNOSIS — Y9389 Activity, other specified: Secondary | ICD-10-CM | POA: Diagnosis not present

## 2019-05-23 DIAGNOSIS — R569 Unspecified convulsions: Secondary | ICD-10-CM | POA: Diagnosis not present

## 2019-05-23 DIAGNOSIS — Y929 Unspecified place or not applicable: Secondary | ICD-10-CM | POA: Insufficient documentation

## 2019-05-23 DIAGNOSIS — Z79899 Other long term (current) drug therapy: Secondary | ICD-10-CM | POA: Insufficient documentation

## 2019-05-23 DIAGNOSIS — W19XXXA Unspecified fall, initial encounter: Secondary | ICD-10-CM | POA: Diagnosis not present

## 2019-05-23 DIAGNOSIS — Y999 Unspecified external cause status: Secondary | ICD-10-CM | POA: Diagnosis not present

## 2019-05-23 DIAGNOSIS — S0992XA Unspecified injury of nose, initial encounter: Secondary | ICD-10-CM | POA: Diagnosis present

## 2019-05-23 DIAGNOSIS — F17210 Nicotine dependence, cigarettes, uncomplicated: Secondary | ICD-10-CM | POA: Insufficient documentation

## 2019-05-23 LAB — CBC WITH DIFFERENTIAL/PLATELET
Abs Immature Granulocytes: 0.03 10*3/uL (ref 0.00–0.07)
Basophils Absolute: 0 10*3/uL (ref 0.0–0.1)
Basophils Relative: 0 %
Eosinophils Absolute: 0 10*3/uL (ref 0.0–0.5)
Eosinophils Relative: 0 %
HCT: 42.3 % (ref 39.0–52.0)
Hemoglobin: 15.2 g/dL (ref 13.0–17.0)
Immature Granulocytes: 0 %
Lymphocytes Relative: 18 %
Lymphs Abs: 1.5 10*3/uL (ref 0.7–4.0)
MCH: 30.4 pg (ref 26.0–34.0)
MCHC: 35.9 g/dL (ref 30.0–36.0)
MCV: 84.6 fL (ref 80.0–100.0)
Monocytes Absolute: 0.8 10*3/uL (ref 0.1–1.0)
Monocytes Relative: 9 %
Neutro Abs: 6.2 10*3/uL (ref 1.7–7.7)
Neutrophils Relative %: 73 %
Platelets: 287 10*3/uL (ref 150–400)
RBC: 5 MIL/uL (ref 4.22–5.81)
RDW: 13.1 % (ref 11.5–15.5)
WBC: 8.6 10*3/uL (ref 4.0–10.5)
nRBC: 0 % (ref 0.0–0.2)

## 2019-05-23 LAB — URINALYSIS, ROUTINE W REFLEX MICROSCOPIC
Bacteria, UA: NONE SEEN
Bilirubin Urine: NEGATIVE
Glucose, UA: NEGATIVE mg/dL
Hgb urine dipstick: NEGATIVE
Ketones, ur: 20 mg/dL — AB
Leukocytes,Ua: NEGATIVE
Nitrite: NEGATIVE
Protein, ur: 30 mg/dL — AB
Specific Gravity, Urine: 1.021 (ref 1.005–1.030)
pH: 6 (ref 5.0–8.0)

## 2019-05-23 LAB — COMPREHENSIVE METABOLIC PANEL
ALT: 18 U/L (ref 0–44)
AST: 22 U/L (ref 15–41)
Albumin: 4.9 g/dL (ref 3.5–5.0)
Alkaline Phosphatase: 77 U/L (ref 38–126)
Anion gap: 11 (ref 5–15)
BUN: 15 mg/dL (ref 6–20)
CO2: 21 mmol/L — ABNORMAL LOW (ref 22–32)
Calcium: 9.4 mg/dL (ref 8.9–10.3)
Chloride: 106 mmol/L (ref 98–111)
Creatinine, Ser: 1.19 mg/dL (ref 0.61–1.24)
GFR calc Af Amer: >60 mL/min (ref >60)
GFR calc non Af Amer: >60 mL/min (ref >60)
Glucose, Bld: 71 mg/dL (ref 70–99)
Potassium: 4.3 mmol/L (ref 3.5–5.1)
Sodium: 138 mmol/L (ref 135–145)
Total Bilirubin: 0.9 mg/dL (ref 0.3–1.2)
Total Protein: 7 g/dL (ref 6.5–8.1)

## 2019-05-23 LAB — RAPID URINE DRUG SCREEN, HOSP PERFORMED
Amphetamines: NOT DETECTED
Barbiturates: NOT DETECTED
Benzodiazepines: POSITIVE — AB
Cocaine: NOT DETECTED
Opiates: NOT DETECTED
Tetrahydrocannabinol: POSITIVE — AB

## 2019-05-23 LAB — ETHANOL: Alcohol, Ethyl (B): <10 mg/dL (ref <10)

## 2019-05-23 LAB — MAGNESIUM: Magnesium: 2.1 mg/dL (ref 1.7–2.4)

## 2019-05-23 LAB — CBG MONITORING, ED: Glucose-Capillary: 70 mg/dL (ref 70–99)

## 2019-05-23 MED ORDER — LORAZEPAM 2 MG/ML IJ SOLN
INTRAMUSCULAR | Status: AC
  Filled 2019-05-23: qty 1

## 2019-05-23 NOTE — ED Triage Notes (Signed)
Per EMS:  Pt from home with c/o witnessed full body seizure that lasted approx 2 minutes.  Pt states he has experienced seizures in the past from ETOH detox, states has been years ago.  Last drink was greater than 90 days ago.  Witness reports pt fell during episode.  Pt denies any neck or back pain.   EMS vitals:  BP 110/72 HR 110  CBG 98

## 2019-05-23 NOTE — ED Notes (Signed)
Pt MRI ready, father Harvie Heck at [phone] given update with permission from pt.

## 2019-05-23 NOTE — ED Provider Notes (Signed)
34 year old male initially seen by PA Lawyer in the ER who was later signed out to PA Ammerman pending MRI.  Per PA Lawyer's HPI:  "Patient presents to the emergency department with a seizure that lasted around 2 minutes.  The patient was having a full body seizure according to witnesses.  Patient states he has had seizures years ago after trying to detox from alcohol.  The patient states he is not having them drinking more than 90 days.  Patient states he is having no pain or other issues at this time.  The patient denies chest pain, shortness of breath, headache,blurred vision, neck pain, fever, cough, weakness, numbness, dizziness, anorexia, edema, abdominal pain, nausea, vomiting, diarrhea, rash, back pain, dysuria, hematemesis, bloody stool, near syncope, or syncope."  Physical Exam  BP 120/84   Pulse 84   Resp 20   Ht 6' (1.829 m)   Wt 95.3 kg   SpO2 97%   BMI 28.48 kg/m   Physical Exam Vitals and nursing note reviewed.  Constitutional:      General: He is not in acute distress.    Appearance: He is well-developed.     Comments: Lying in bed with his eyes closed.  Eyes open to voice.  HENT:     Head: Normocephalic and atraumatic.  Eyes:     Pupils: Pupils are equal, round, and reactive to light.  Neck:     Trachea: No tracheal deviation.  Cardiovascular:     Rate and Rhythm: Normal rate.  Pulmonary:     Effort: Pulmonary effort is normal. No respiratory distress.  Abdominal:     General: There is no distension.     Palpations: Abdomen is soft.  Musculoskeletal:        General: Normal range of motion.     Cervical back: Normal range of motion and neck supple.  Skin:    General: Skin is warm and dry.  Neurological:     Mental Status: He is alert and oriented to person, place, and time.     Comments: No seizure-like activity noted.  Psychiatric:        Behavior: Behavior normal.     ED Course/Procedures   Clinical Course as of May 23 757  Sat May 23, 2019  2148  Called MRI to inquire about patient's MRI status and MRI tech notes he is 2nd in line.    [CA]    Clinical Course User Index [CA] Jesusita Oka    Procedures  MDM   34 year old male received at signout pending MRI.  Please see notes from PA Lawyer and PA Aberman for further work-up and medical decision making.  In brief, the patient had a seizure that lasted approximately 2 minutes yesterday.  Neurology was consulted who recommended EEG and MRI.  Patient can be discharged to home if EEG and MRI are normal and neurology signed off on the patient.   EEG performed earlier today and is normal.  MRI was reviewed with Dr. Wilford Corner, neurology who does not see any abnormalities.  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  MRI findings and recommendations were discussed with the patient who is agreeable at this time.  He was specifically counseled on not operating motor vehicles until he is cleared by neurology.  All questions answered.  He is hemodynamically stable and in  no acute distress.  ER return precautions given.  Safe for discharge to home with outpatient follow-up as indicated.    Joline Maxcy A, PA-C 05/24/19 0759    Ripley Fraise, MD 05/24/19 [phone]

## 2019-05-23 NOTE — ED Provider Notes (Signed)
Care assumed from [facility] [place], PA-C at shift change. See his note for full HPI.  In short, patient presents to the ED after full body convulsions that lasted roughly 2 minutes. Patent last had a seizure roughly 9 years ago due to alcohol withdraw, but hasn't had one since. Patient denies alcohol consumption for the past 90 days. Patient does admit to decreasing his Buspar dose from 3 pills to 1 pill over the past 2 weeks, but denies any other changes to medications.   Previous provider spoke to Dr. Otelia Limes with neurology who recommended EEG and MRI of brain. Plan is to follow-up with EEG/MRI and speak to Dr. Otelia Limes about disposition.   Physical Exam  BP 119/80   Pulse 92   Resp (!) 24   Ht 6' (1.829 m)   Wt 95.3 kg   SpO2 99%   BMI 28.48 kg/m   Physical Exam Vitals and nursing note reviewed.  Constitutional:      General: He is not in acute distress.    Appearance: He is not ill-appearing.  HENT:     Head: Normocephalic.     Ears:     Comments: No hemotympanum    Nose:     Comments: Small abrasion to bridge of nose. No septal hematoma.  Eyes:     Pupils: Pupils are equal, round, and reactive to light.  Neck:     Comments: No midline cervical tenderness.  Cardiovascular:     Rate and Rhythm: Normal rate and regular rhythm.     Pulses: Normal pulses.     Heart sounds: Normal heart sounds. No murmur. No friction rub. No gallop.   Pulmonary:     Effort: Pulmonary effort is normal.     Breath sounds: Normal breath sounds.  Abdominal:     General: Abdomen is flat. There is no distension.     Palpations: Abdomen is soft.     Tenderness: There is no abdominal tenderness. There is no guarding or rebound.  Musculoskeletal:     Cervical back: Neck supple.     Right lower leg: No edema.     Left lower leg: No edema.     Comments: Able to move all 4 extremities without difficulty. No thoracic or lumbar midline tenderness.   Skin:    General: Skin is warm.  Neurological:     General: No focal deficit present.     Mental Status: He is alert.     Comments: Speech is clear, able to follow commands CN III-XII intact Normal strength in upper and lower extremities bilaterally including dorsiflexion and plantar flexion, strong and equal grip strength Sensation grossly intact throughout Moves extremities without ataxia, coordination intact No pronator drift Ambulates without difficulty     ED Course/Procedures   Clinical Course as of May 22 2357  Sat May 23, 2019  2148 Called MRI to inquire about patient's MRI status and MRI tech notes he is 2nd in line.    [CA]    Clinical Course User Index [CA] Mannie Stabile, PA-C    Procedures  MDM  Patient presents to the ED after a 2 minute seizure. History of seizures with last seizure roughly 9 years ago due to alcohol withdraw. Per patient, no alcohol consumption within the past 90 days.   UDS positive for benzodiazepines and THC.  UA positive for ketonuria and proteinuria, but negative for signs of infection.  Ethanol within normal limits.  Magnesium normal at 2.1.  CBC completely unremarkable with  no leukocytosis.  CMP reassuring with normal renal function, and very slight decreased CO2 at 21, but otherwise unremarkable.  EKG personally reviewed which is significant for sinus rhythm with borderline prolonged QTc, but no signs of ischemia.  EEG study reviewed which demonstrates no seizures or epileptiform discharges were seen throughout the recording.  Patient handed off to Joline Maxcy, PA-C at shift change who will follow-up with MRI results and consult neurology for further guidance and disposition.          Karie Kirks 05/24/19 0036    Quintella Reichert, MD 05/24/19 2356

## 2019-05-23 NOTE — ED Notes (Signed)
EEG to bedside 

## 2019-05-23 NOTE — ED Provider Notes (Signed)
[facility] EMERGENCY DEPARTMENT Provider Note   CSN: [id number] Arrival date & time: 05/23/19  1329     History No chief complaint on file.   Shane Macias is a 34 y.o. male.  HPI Patient presents to the emergency department with a seizure that lasted around 2 minutes.  The patient was having a full body seizure according to witnesses.  Patient states he has had seizures years ago after trying to detox from alcohol.  The patient states he is not having them drinking more than 90 days.  Patient states he is having no pain or other issues at this time.  The patient denies chest pain, shortness of breath, headache,blurred vision, neck pain, fever, cough, weakness, numbness, dizziness, anorexia, edema, abdominal pain, nausea, vomiting, diarrhea, rash, back pain, dysuria, hematemesis, bloody stool, near syncope, or syncope.    Past Medical History:  Diagnosis Date  . Alcohol abuse     There are no problems to display for this patient.   Past Surgical History:  Procedure Laterality Date  . ROTATOR CUFF REPAIR         No family history on file.  Social History   Tobacco Use  . Smoking status: Current Every Day Smoker    Types: Cigarettes  Substance Use Topics  . Alcohol use: Yes    Alcohol/week: 49.0 standard drinks    Types: 49 Standard drinks or equivalent per week  . Drug use: Yes    Types: Marijuana    Comment: did Mushrroms 12 yrs ago, tried cocaine, xanax,pain killer    Home Medications Prior to Admission medications   Medication Sig Start Date End Date Taking? Authorizing Provider  acamprosate (CAMPRAL) 333 MG tablet Take 666 mg by mouth 2 (two) times daily. 02/02/19  Yes [provider]  ARIPiprazole (ABILIFY) 10 MG tablet Take 10 mg by mouth daily. 04/11/19  Yes [provider]  busPIRone (BUSPAR) 15 MG tablet Take 15 mg by mouth daily.  04/11/19  Yes [provider]  DULoxetine (CYMBALTA) 30 MG capsule Take 30 mg by  mouth at bedtime.  05/18/19  Yes [provider]  Multiple Vitamin (MULITIVITAMIN WITH MINERALS) TABS Take 1 tablet by mouth daily.   Yes [provider]  naltrexone (DEPADE) 50 MG tablet Take 50 mg by mouth daily. 02/03/19  Yes [provider]  SUMAtriptan (IMITREX) 100 MG tablet Take 50-100 mg by mouth as needed for migraine. 09/30/18  Yes [provider]    Allergies    Patient has no known allergies.  Review of Systems   Review of Systems All other systems negative except as documented in the HPI. All pertinent positives and negatives as reviewed in the HPI. Physical Exam Updated Vital Signs BP 119/80   Pulse 92   Resp (!) 24   Ht 6' (1.829 m)   Wt 95.3 kg   SpO2 99%   BMI 28.48 kg/m   Physical Exam Vitals and nursing note reviewed.  Constitutional:      General: He is not in acute distress.    Appearance: He is well-developed.  HENT:     Head: Normocephalic.   Eyes:     Pupils: Pupils are equal, round, and reactive to light.  Cardiovascular:     Rate and Rhythm: Normal rate and regular rhythm.     Heart sounds: Normal heart sounds. No murmur. No friction rub. No gallop.   Pulmonary:     Effort: Pulmonary effort is normal. No respiratory  distress.     Breath sounds: Normal breath sounds. No wheezing.  Abdominal:     General: Bowel sounds are normal. There is no distension.     Palpations: Abdomen is soft.     Tenderness: There is no abdominal tenderness.  Musculoskeletal:     Cervical back: Normal range of motion and neck supple.  Skin:    General: Skin is warm and dry.     Capillary Refill: Capillary refill takes less than 2 seconds.     Findings: No erythema or rash.  Neurological:     Mental Status: He is alert and oriented to person, place, and time.     Motor: No abnormal muscle tone.     Coordination: Coordination normal.  Psychiatric:        Behavior: Behavior normal.     ED Results / Procedures / Treatments   Labs  (all labs ordered are listed, but only abnormal results are displayed) Labs Reviewed  COMPREHENSIVE METABOLIC PANEL - Abnormal; Notable for the following components:      Result Value   CO2 21 (*)    All other components within normal limits  CBC WITH DIFFERENTIAL/PLATELET  ETHANOL  URINALYSIS, ROUTINE W REFLEX MICROSCOPIC  RAPID URINE DRUG SCREEN, HOSP PERFORMED  CBG MONITORING, ED    EKG None  Radiology No results found.  Procedures Procedures (including critical care time)  Medications Ordered in ED Medications - No data to display  ED Course  I have reviewed the triage vital signs and the nursing notes.  Pertinent labs & imaging results that were available during my care of the patient were reviewed by me and considered in my medical decision making (see chart for details).    MDM Rules/Calculators/A&P                      I spoke with Dr. Cheral Marker from neurology who advised the patient will need further evaluation and work-up. Final Clinical Impression(s) / ED Diagnoses Final diagnoses:  None    Rx / DC Orders ED Discharge Orders    None       Dalia Heading, PA-C 05/23/19 [place], MD 05/25/19 1505

## 2019-05-23 NOTE — ED Notes (Signed)
Pt given food, MD okay'd

## 2019-05-23 NOTE — ED Notes (Signed)
Received report from [facility]. Pt iin no acute distress at this time; has been provided urinal for urine sample. Pt states last drink was 90 days ago. Pt does have a small abrasion to nose from seizure onto carpet.

## 2019-05-23 NOTE — ED Notes (Signed)
Patient transported to MRI 

## 2019-05-23 NOTE — Progress Notes (Signed)
EEG complete - results pending 

## 2019-05-23 NOTE — Procedures (Signed)
Patient Name: Taner Rzepka  MRN: [id number]  Epilepsy Attending: Charlsie Quest  Referring Physician/Provider: Dr Caryl Pina Date: 05/23/2019 Duration: 23.39 mins  Patient history: 33yo M who presented with seizure. EEG to evaluate for seizure.  Level of alertness: awake  AEDs during EEG study: None  Technical aspects: This EEG study was done with scalp electrodes positioned according to the 10-20 International system of electrode placement. Electrical activity was acquired at a sampling rate of 500Hz  and reviewed with a high frequency filter of 70Hz  and a low frequency filter of 1Hz . EEG data were recorded continuously and digitally stored.   DESCRIPTION:The posterior dominant rhythm consists of 8-9 Hz activity of moderate voltage (25-35 uV) seen predominantly in posterior head regions, symmetric and reactive to eye opening and eye closing. Physiologic photic stimulation was seen during photic stimulation.          Hyperventilation was not performed.  IMPRESSION: This study during awake state is within normal limits. No seizures or epileptiform discharges were seen throughout the recording.  If suspicion for interictal activity remains a concern, a prolonged study including sleep can be considered.   Yerania Chamorro 

## 2019-05-24 MED ORDER — GADOBUTROL 1 MMOL/ML IV SOLN
9.0000 mL | Freq: Once | INTRAVENOUS | Status: AC | PRN
  Administered 2019-05-24: 9 mL via INTRAVENOUS

## 2019-05-24 NOTE — ED Notes (Signed)
Discharge instructions including follow up care discussed with pt. Pt verbalized understanding with no questions at this time. Pt to go home with father.

## 2019-05-24 NOTE — Discharge Instructions (Addendum)
Thank you for allowing me to care for you today in the Emergency Department.   Call to schedule a follow-up appointment with outpatient neurology.  The number for [facility] is listed above.  You cannot drive or operate a motor vehicle until you have been cleared by neurology.  I am also attaching a resource guide for outpatient substance use along with your discharge paperwork.

## 2019-08-17 ENCOUNTER — Emergency Department (HOSPITAL_COMMUNITY)
Admission: EM | Admit: 2019-08-17 | Discharge: 2019-08-17 | Disposition: A | Discharge status: Home / Self Care | Payer: BLUE CROSS/BLUE SHIELD | Attending: Emergency Medicine | Admitting: Emergency Medicine

## 2019-08-17 DIAGNOSIS — F17210 Nicotine dependence, cigarettes, uncomplicated: Secondary | ICD-10-CM | POA: Diagnosis not present

## 2019-08-17 DIAGNOSIS — Z79899 Other long term (current) drug therapy: Secondary | ICD-10-CM | POA: Diagnosis not present

## 2019-08-17 DIAGNOSIS — F1210 Cannabis abuse, uncomplicated: Secondary | ICD-10-CM | POA: Insufficient documentation

## 2019-08-17 DIAGNOSIS — F1910 Other psychoactive substance abuse, uncomplicated: Secondary | ICD-10-CM | POA: Diagnosis not present

## 2019-08-17 DIAGNOSIS — R410 Disorientation, unspecified: Secondary | ICD-10-CM | POA: Insufficient documentation

## 2019-08-17 DIAGNOSIS — Z008 Encounter for other general examination: Secondary | ICD-10-CM | POA: Diagnosis present

## 2019-08-17 NOTE — ED Triage Notes (Signed)
Patient arrived with a friend who states he is here for a heroin overdose. Patient unable to give this nurse any information. Story from patient continues to change about reason for arrival but denies any drug use.

## 2019-08-17 NOTE — ED Notes (Signed)
Pt had called his father to come pick him up.The provider was notified of this fact. He replied that we could disregard the lab work.

## 2019-08-17 NOTE — ED Notes (Signed)
Patient ambulating with no difficulty, stating he needs a to call his dad that a friend took his phone when he dropped him off.

## 2019-08-17 NOTE — ED Notes (Signed)
EDP notified that patients ride is here, agreed to discharge without blood work. Patient alert and answering questions appropriately.

## 2019-08-17 NOTE — ED Provider Notes (Signed)
[facility]-EMERGENCY DEPT Provider Note   CSN: [id number] Arrival date & time: 08/17/19  0102     History Chief Complaint  Patient presents with  . Medical Clearance    Shane Macias is a 34 y.o. male.  Patient was brought to the emergency department by a friend.  Patient reports that he has a history of polysubstance drug abuse and has been clean for some time but decided to do heroin tonight.  He thinks that it was laced with fentanyl.  Unclear exactly what happened before he arrived in the ER, friend dropped him off and left.  Initially the patient was somewhat disoriented but is improving.  He denies any thoughts of harming himself or others.        Past Medical History:  Diagnosis Date  . Alcohol abuse     There are no problems to display for this patient.   Past Surgical History:  Procedure Laterality Date  . ROTATOR CUFF REPAIR         No family history on file.  Social History   Tobacco Use  . Smoking status: Current Every Day Smoker    Types: Cigarettes  Substance Use Topics  . Alcohol use: Yes    Alcohol/week: 49.0 standard drinks    Types: 49 Standard drinks or equivalent per week  . Drug use: Yes    Types: Marijuana    Comment: did Mushrroms 12 yrs ago, tried cocaine, xanax,pain killer    Home Medications Prior to Admission medications   Medication Sig Start Date End Date Taking? Authorizing Provider  acamprosate (CAMPRAL) 333 MG tablet Take 666 mg by mouth 2 (two) times daily. 02/02/19   [provider]  ARIPiprazole (ABILIFY) 10 MG tablet Take 10 mg by mouth daily. 04/11/19   [provider]  busPIRone (BUSPAR) 15 MG tablet Take 15 mg by mouth daily.  04/11/19   [provider]  DULoxetine (CYMBALTA) 30 MG capsule Take 30 mg by mouth at bedtime.  05/18/19   [provider]  Multiple Vitamin (MULITIVITAMIN WITH MINERALS) TABS Take 1 tablet by mouth daily.    [provider]    naltrexone (DEPADE) 50 MG tablet Take 50 mg by mouth daily. 02/03/19   [provider]  SUMAtriptan (IMITREX) 100 MG tablet Take 50-100 mg by mouth as needed for migraine. 09/30/18   [provider]    Allergies    Patient has no known allergies.  Review of Systems   Review of Systems  Psychiatric/Behavioral: Negative for suicidal ideas.  All other systems reviewed and are negative.   Physical Exam Updated Vital Signs BP (!) 145/96 (BP Location: Left Arm)   Pulse (!) 115   Temp 98.6 F (37 C) (Oral)   Resp 20   Ht 6' (1.829 m)   Wt 95.3 kg   SpO2 100%   BMI 28.48 kg/m   Physical Exam Vitals and nursing note reviewed.  Constitutional:      General: He is not in acute distress.    Appearance: Normal appearance. He is well-developed.  HENT:     Head: Normocephalic and atraumatic.     Right Ear: Hearing normal.     Left Ear: Hearing normal.     Nose: Nose normal.  Eyes:     Conjunctiva/sclera: Conjunctivae normal.     Pupils: Pupils are equal, round, and reactive to light.  Cardiovascular:     Rate and Rhythm: Regular rhythm.     Heart sounds:  S1 normal and S2 normal. No murmur. No friction rub. No gallop.   Pulmonary:     Effort: Pulmonary effort is normal. No respiratory distress.     Breath sounds: Normal breath sounds.  Chest:     Chest wall: No tenderness.  Abdominal:     General: Bowel sounds are normal.     Palpations: Abdomen is soft.     Tenderness: There is no abdominal tenderness. There is no guarding or rebound. Negative signs include Murphy's sign and McBurney's sign.     Hernia: No hernia is present.  Musculoskeletal:        General: Normal range of motion.     Cervical back: Normal range of motion and neck supple.  Skin:    General: Skin is warm and dry.     Findings: No rash.  Neurological:     Mental Status: He is alert and oriented to person, place, and time.     GCS: GCS eye subscore is 4. GCS verbal subscore is 5. GCS motor  subscore is 6.     Cranial Nerves: No cranial nerve deficit.     Sensory: No sensory deficit.     Coordination: Coordination normal.  Psychiatric:        Speech: Speech normal.        Behavior: Behavior normal.        Thought Content: Thought content normal.     ED Results / Procedures / Treatments   Labs (all labs ordered are listed, but only abnormal results are displayed) Labs Reviewed  COMPREHENSIVE METABOLIC PANEL  ETHANOL  SALICYLATE LEVEL  ACETAMINOPHEN LEVEL  CBC  RAPID URINE DRUG SCREEN, HOSP PERFORMED    EKG None  Radiology No results found.  Procedures Procedures (including critical care time)  Medications Ordered in ED Medications - No data to display  ED Course  I have reviewed the triage vital signs and the nursing notes.  Pertinent labs & imaging results that were available during my care of the patient were reviewed by me and considered in my medical decision making (see chart for details).    MDM Rules/Calculators/A&P                      Patient initially confused but well-appearing upon arrival.  His sensorium has cleared and he is now oriented and answering all questions appropriately.  He is not homicidal or suicidal.  He does not wish to pursue any help with his polysubstance drug use.  I do not see any reason to pursue IVC and the patient as he is not a harm to himself or others.  He is calling his father to pick him up.  Final Clinical Impression(s) / ED Diagnoses Final diagnoses:  Polysubstance abuse [facility])    Rx / DC Orders ED Discharge Orders    None       Ervine Witucki, Gwenyth Allegra, MD 08/17/19 [phone]

## 2019-08-26 ENCOUNTER — Encounter (HOSPITAL_COMMUNITY): Payer: Self-pay | Admitting: Emergency Medicine

## 2019-08-26 ENCOUNTER — Emergency Department (HOSPITAL_COMMUNITY)
Admission: EM | Admit: 2019-08-26 | Discharge: 2019-08-27 | Disposition: A | Discharge status: Home / Self Care | Payer: BLUE CROSS/BLUE SHIELD | Attending: Emergency Medicine | Admitting: Emergency Medicine

## 2019-08-26 ENCOUNTER — Other Ambulatory Visit: Payer: Self-pay

## 2019-08-26 DIAGNOSIS — F10230 Alcohol dependence with withdrawal, uncomplicated: Secondary | ICD-10-CM | POA: Insufficient documentation

## 2019-08-26 DIAGNOSIS — F17210 Nicotine dependence, cigarettes, uncomplicated: Secondary | ICD-10-CM | POA: Diagnosis not present

## 2019-08-26 DIAGNOSIS — Z79899 Other long term (current) drug therapy: Secondary | ICD-10-CM | POA: Diagnosis not present

## 2019-08-26 DIAGNOSIS — Y907 Blood alcohol level of 200-239 mg/100 ml: Secondary | ICD-10-CM | POA: Insufficient documentation

## 2019-08-26 DIAGNOSIS — F10930 Alcohol use, unspecified with withdrawal, uncomplicated: Secondary | ICD-10-CM

## 2019-08-26 DIAGNOSIS — F10239 Alcohol dependence with withdrawal, unspecified: Secondary | ICD-10-CM | POA: Diagnosis present

## 2019-08-26 MED ORDER — LORAZEPAM 1 MG PO TABS
2.00 | ORAL_TABLET | ORAL | Status: DC
Start: ? — End: 2019-08-26

## 2019-08-26 MED ORDER — FOLIC ACID 1 MG PO TABS
1.00 | ORAL_TABLET | ORAL | Status: DC
Start: ? — End: 2019-08-26

## 2019-08-26 MED ORDER — ACETAMINOPHEN 325 MG PO TABS
650.00 | ORAL_TABLET | ORAL | Status: DC
Start: ? — End: 2019-08-26

## 2019-08-26 MED ORDER — TRAZODONE HCL 50 MG PO TABS
50.00 | ORAL_TABLET | ORAL | Status: DC
Start: ? — End: 2019-08-26

## 2019-08-26 MED ORDER — LORAZEPAM 2 MG/ML IJ SOLN
2.00 | INTRAMUSCULAR | Status: DC
Start: ? — End: 2019-08-26

## 2019-08-26 MED ORDER — QUINTABS PO TABS
1.00 | ORAL_TABLET | ORAL | Status: DC
Start: ? — End: 2019-08-26

## 2019-08-26 MED ORDER — NICOTINE 14 MG/24HR TD PT24
1.00 | MEDICATED_PATCH | TRANSDERMAL | Status: DC
Start: ? — End: 2019-08-26

## 2019-08-26 MED ORDER — THIAMINE HCL 100 MG PO TABS
100.00 | ORAL_TABLET | ORAL | Status: DC
Start: ? — End: 2019-08-26

## 2019-08-26 MED ORDER — DERMAKLENZ EX
100.00 | CUTANEOUS | Status: DC
Start: 2019-08-25 — End: 2019-08-26

## 2019-08-26 MED ORDER — ONDANSETRON 4 MG PO TBDP
4.00 | ORAL_TABLET | ORAL | Status: DC
Start: ? — End: 2019-08-26

## 2019-08-26 MED ORDER — GENERIC EXTERNAL MEDICATION
2.00 | Status: DC
Start: ? — End: 2019-08-26

## 2019-08-26 NOTE — ED Triage Notes (Addendum)
Patient requesting detox for his alcoholism last drink this evening , denies SI or HI , no hallucinations , alert and oriented , respirations unlabored , ambulatory .

## 2019-08-27 LAB — CBC
HCT: 47.3 % (ref 39.0–52.0)
Hemoglobin: 16.5 g/dL (ref 13.0–17.0)
MCH: 31.1 pg (ref 26.0–34.0)
MCHC: 34.9 g/dL (ref 30.0–36.0)
MCV: 89.1 fL (ref 80.0–100.0)
Platelets: 413 10*3/uL — ABNORMAL HIGH (ref 150–400)
RBC: 5.31 MIL/uL (ref 4.22–5.81)
RDW: 14.8 % (ref 11.5–15.5)
WBC: 7.8 10*3/uL (ref 4.0–10.5)
nRBC: 0 % (ref 0.0–0.2)

## 2019-08-27 LAB — RAPID URINE DRUG SCREEN, HOSP PERFORMED
Amphetamines: NOT DETECTED
Barbiturates: NOT DETECTED
Benzodiazepines: NOT DETECTED
Cocaine: NOT DETECTED
Opiates: NOT DETECTED
Tetrahydrocannabinol: NOT DETECTED

## 2019-08-27 LAB — COMPREHENSIVE METABOLIC PANEL
ALT: 62 U/L — ABNORMAL HIGH (ref 0–44)
AST: 47 U/L — ABNORMAL HIGH (ref 15–41)
Albumin: 4.4 g/dL (ref 3.5–5.0)
Alkaline Phosphatase: 112 U/L (ref 38–126)
Anion gap: 12 (ref 5–15)
BUN: <5 mg/dL — ABNORMAL LOW (ref 6–20)
CO2: 31 mmol/L (ref 22–32)
Calcium: 9.4 mg/dL (ref 8.9–10.3)
Chloride: 101 mmol/L (ref 98–111)
Creatinine, Ser: 0.91 mg/dL (ref 0.61–1.24)
GFR calc Af Amer: >60 mL/min (ref >60)
GFR calc non Af Amer: >60 mL/min (ref >60)
Glucose, Bld: 120 mg/dL — ABNORMAL HIGH (ref 70–99)
Potassium: 4.7 mmol/L (ref 3.5–5.1)
Sodium: 144 mmol/L (ref 135–145)
Total Bilirubin: 0.5 mg/dL (ref 0.3–1.2)
Total Protein: 7.4 g/dL (ref 6.5–8.1)

## 2019-08-27 LAB — ETHANOL: Alcohol, Ethyl (B): 236 mg/dL — ABNORMAL HIGH (ref <10)

## 2019-08-27 MED ORDER — CHLORDIAZEPOXIDE HCL 25 MG PO CAPS: ORAL_CAPSULE | ORAL | 0 refills | Status: AC

## 2019-08-27 MED ORDER — CHLORDIAZEPOXIDE HCL 25 MG PO CAPS
75.0000 mg | ORAL_CAPSULE | Freq: Once | ORAL | Status: AC
  Administered 2019-08-27: 75 mg via ORAL
  Filled 2019-08-27: qty 3

## 2019-08-27 NOTE — Discharge Instructions (Addendum)
I prescribed you a medication called Librium for your alcohol withdrawal. You received a dose in the ER.  You should fill the prescription at your pharmacy and USE AS DIRECTED.  The purpose is to prevent severe withdrawal symptoms and seizures.  This medicine is not a replacement for detox program or alcoholics anonymous or any other form of outpatient counseling.  I gave you a list of resources advised you to call them today.

## 2019-08-27 NOTE — ED Notes (Signed)
Pt states he usually drinks 12-14 "bootleggers" that are 12% alcohol daily. Pt's last drink was last night. Pt has visible tremors, headache, feels anxious. Hx of seizures with alcohol withdrawal. CIWA 10 at this time. Notified MD.

## 2019-08-27 NOTE — ED Provider Notes (Signed)
[facility] EMERGENCY DEPARTMENT Provider Note   CSN: [id number] Arrival date & time: 08/26/19  2257     History Chief Complaint  Patient presents with  . Alcohol Problem    Shane Macias is a 34 y.o. male history of chronic alcohol use presenting to the emergency department with concern for alcohol withdrawal.  Patient reports that he drinks about 12-14 "bootleggers" (like a fortified beer, or malt beverage, 12% alcohol) daily.  His last drink was immediately prior to arrival in the emergency department yesterday evening.  He reports that he tends to drink from 6 AM when he wakes up until when he goes to bed at night.  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  He wishes to get placed in detox for his alcoholism.  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  But he tells me "I can't go stay with them until I'm clean."  Currently he feels jittery, shaky, like he is withdrawing.  HPI     Past Medical History:  Diagnosis Date  . Alcohol abuse     There are no problems to display for this patient.   Past Surgical History:  Procedure Laterality Date  . ROTATOR CUFF REPAIR         No family history on file.  Social History   Tobacco Use  . Smoking status: Current Every Day Smoker    Types: Cigarettes  Substance Use Topics  . Alcohol use: Yes    Alcohol/week: 49.0 standard drinks    Types: 49 Standard drinks or equivalent per week  . Drug use: Yes    Types: Marijuana    Comment: did Mushrroms 12 yrs ago, tried cocaine, xanax,pain killer    Home Medications Prior to Admission medications   Medication Sig Start Date End Date Taking? Authorizing Provider  acamprosate (CAMPRAL) 333 MG tablet Take 666 mg by mouth 2 (two) times daily. 02/02/19   [provider]  ARIPiprazole  (ABILIFY) 10 MG tablet Take 10 mg by mouth daily. 04/11/19   [provider]  busPIRone (BUSPAR) 15 MG tablet Take 15 mg by mouth daily.  04/11/19   [provider]  chlordiazePOXIDE (LIBRIUM) 25 MG capsule 50mg  PO TID x 1D, then 25-50mg  PO BID X 1D, then 25-50mg  PO QD X 1D 08/27/19   08/29/19, MD  DULoxetine (CYMBALTA) 30 MG capsule Take 30 mg by mouth at bedtime.  05/18/19   [provider]  Multiple Vitamin (MULITIVITAMIN WITH MINERALS) TABS Take 1 tablet by mouth daily.    [provider]  naltrexone (DEPADE) 50 MG tablet Take 50 mg by mouth daily. 02/03/19   [provider]  SUMAtriptan (IMITREX) 100 MG tablet Take 50-100 mg by mouth as needed for migraine. 09/30/18   [provider]    Allergies    Patient has no known allergies.  Review of Systems   Review of Systems  Constitutional: Positive for appetite change, chills and fatigue. Negative for fever.  Eyes: Negative for pain and visual disturbance.  Respiratory: Negative for cough and shortness of breath.   Cardiovascular: Positive for palpitations. Negative for chest pain.  Gastrointestinal: Positive for nausea. Negative for abdominal pain.  Genitourinary: Negative for dysuria and hematuria.  Musculoskeletal: Positive for arthralgias and myalgias.  Skin: Negative  for color change and rash.  Allergic/Immunologic: Negative for food allergies and immunocompromised state.  Neurological: Positive for light-headedness and headaches. Negative for syncope and speech difficulty.  All other systems reviewed and are negative.   Physical Exam Updated Vital Signs BP (!) 126/93 (BP Location: Right Arm)   Pulse 99   Temp 97.7 F (36.5 C) (Oral)   Resp 11   Ht 6' (1.829 m)   Wt 105 kg   SpO2 100%   BMI 31.39 kg/m   Physical Exam Vitals and nursing note reviewed.  Constitutional:      Appearance: He is well-developed.  HENT:     Head: Normocephalic and atraumatic.  Eyes:       Conjunctiva/sclera: Conjunctivae normal.  Cardiovascular:     Rate and Rhythm: Regular rhythm. Tachycardia present.     Pulses: Normal pulses.     Heart sounds: No murmur.     Comments: HR 98-102 bpm Pulmonary:     Effort: Pulmonary effort is normal. No respiratory distress.     Breath sounds: Normal breath sounds.  Abdominal:     Palpations: Abdomen is soft.     Tenderness: There is no abdominal tenderness.  Musculoskeletal:     Cervical back: Neck supple.  Skin:    General: Skin is warm and dry.  Neurological:     Mental Status: He is alert.     Comments: Mild tremors to bilateral hands  Psychiatric:        Mood and Affect: Mood normal.        Behavior: Behavior normal.     ED Results / Procedures / Treatments   Labs (all labs ordered are listed, but only abnormal results are displayed) Labs Reviewed  COMPREHENSIVE METABOLIC PANEL - Abnormal; Notable for the following components:      Result Value   Glucose, Bld 120 (*)    BUN <5 (*)    AST 47 (*)    ALT 62 (*)    All other components within normal limits  ETHANOL - Abnormal; Notable for the following components:   Alcohol, Ethyl (B) 236 (*)    All other components within normal limits  CBC - Abnormal; Notable for the following components:   Platelets 413 (*)    All other components within normal limits  RAPID URINE DRUG SCREEN, HOSP PERFORMED    EKG None  Radiology No results found.  Procedures Procedures (including critical care time)  Medications Ordered in ED Medications  chlordiazePOXIDE (LIBRIUM) capsule 75 mg (75 mg Oral Given 08/27/19 0825)    ED Course  I have reviewed the triage vital signs and the nursing notes.  Pertinent labs & imaging results that were available during my care of the patient were reviewed by me and considered in my medical decision making (see chart for details).  34 yo male w/ hx of etoh daily consumption, reported withdrawal seizures, here with concern for etoh  withdrawal.  Last drink just prior to arrival last night.  He spent approx 10 hours in the waiting room prior to being seen by myself this morning.  Per nursing CIWA score of 10 this AM.  He otherwise looks stable and fairly comfortable in the room.    Per chart review, he has been seen multiple times at our hospital and other hospitals (including [facility] on 4/19) for etoh-related issues, seeking inpatient hospitalization for rehab.  I do not believe he requires inpatient hospitalization at this time.  I am willing to prescribe him  librium for his reported history of withdrawal seizures, but explained this is not a substitute for outpatient management.  We'll give him resources for rehab facilities.  He reports understanding of this plan.  Stable for discharge after an initial dose of librium is given here.     Final Clinical Impression(s) / ED Diagnoses Final diagnoses:  Alcohol withdrawal syndrome without complication (HCC)    Rx / DC Orders ED Discharge Orders         Ordered    chlordiazePOXIDE (LIBRIUM) 25 MG capsule     08/27/19 0841           Terald Sleeper, MD 08/27/19 1744

## 2021-08-19 IMAGING — MR MR HEAD WO/W CM
14 of 16 series · 40 of 48 positions shown · IV contrast (Contrast agent)
Comparison: Prior head CT from 05/22/2018.

CLINICAL DATA: Initial evaluation for acute seizure.

EXAM:
MRI HEAD WITHOUT AND WITH CONTRAST
TECHNIQUE: Multiplanar, multiecho pulse sequences of the brain and surrounding
structures were obtained without and with intravenous contrast.
CONTRAST:  9mL GADAVIST GADOBUTROL 1 MMOL/ML IV SOLN

[Series 5: DWI · axial · 3.0mm · 0.88mm/px · z∈[-64,+75]mm · 6 of 96 slices shown (1 of 4)]
[im 1/96]
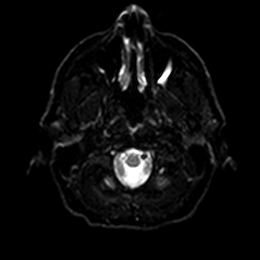
[im 20/96]
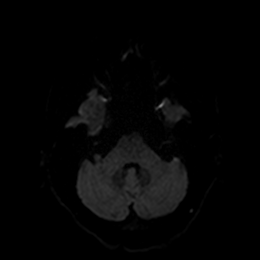
[im 39/96]
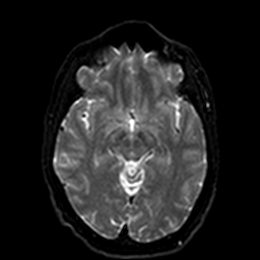
[im 58/96]
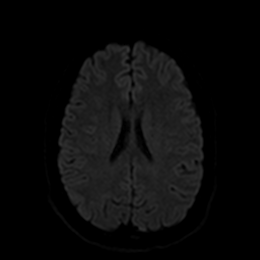
[im 77/96]
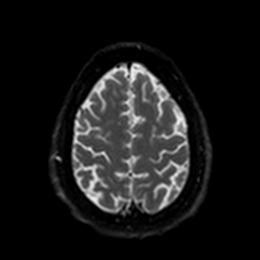
[im 96/96]
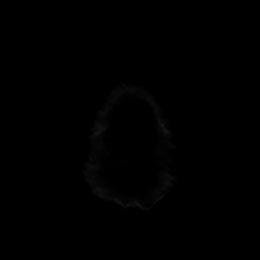

[Series 6: DWI · axial · 3.0mm · 0.88mm/px · z∈[-64,+75]mm · 3 of 48 slices shown (2 of 4)]
[im 1/48]
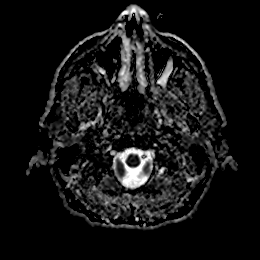
[im 24/48]
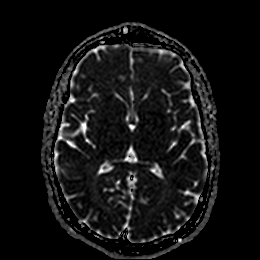
[im 48/48]
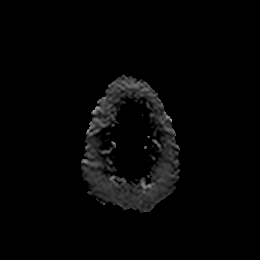

[Series 7: DWI · coronal · 4.0mm · 0.88mm/px · 5 of 72 slices shown (3 of 4)]
[im 1/72]
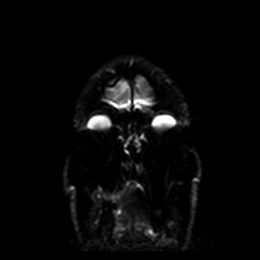
[im 18/72]
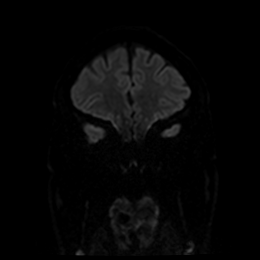
[im 36/72]
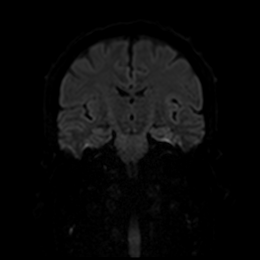
[im 54/72]
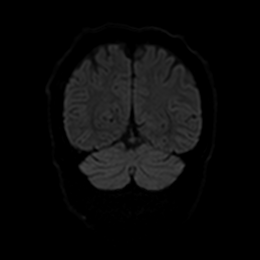
[im 72/72]
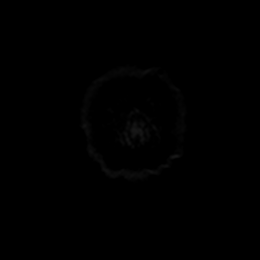

[Series 8: DWI · coronal · 4.0mm · 0.88mm/px · 2 of 36 slices shown (4 of 4)]
[im 1/36]
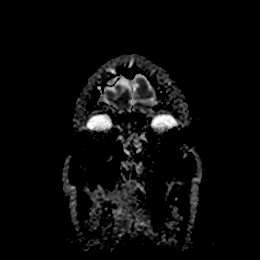
[im 36/36]
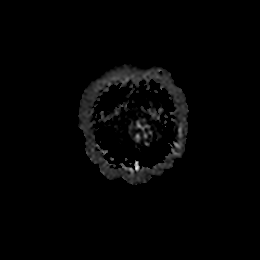

[Series 9: T1 · sagittal · 5.0mm · 0.75mm/px · 2 of 25 slices shown]
[im 1/25]
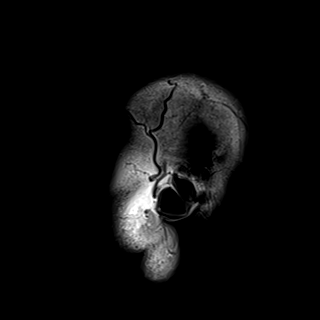
[im 25/25]
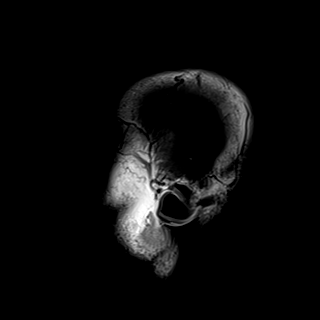

[Series 10: T2 · axial · 5.0mm · 0.72mm/px · z∈[-63,+80]mm · 2 of 25 slices shown (1 of 2)]
[im 1/25]
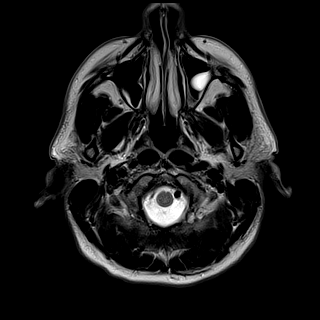
[im 25/25]
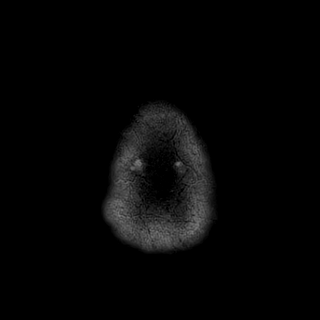

[Series 11: FLAIR · axial · 5.0mm · 0.45mm/px · z∈[-63,+80]mm · 2 of 25 slices shown (1 of 2)]
[im 1/25]
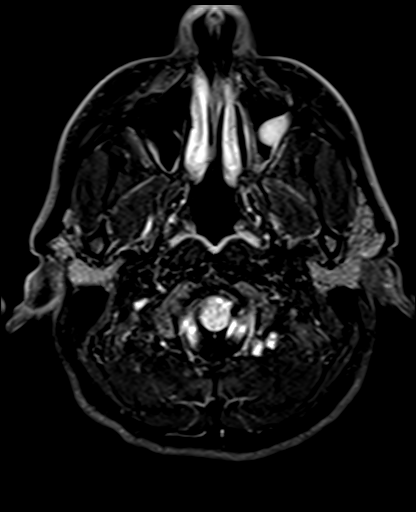
[im 25/25]
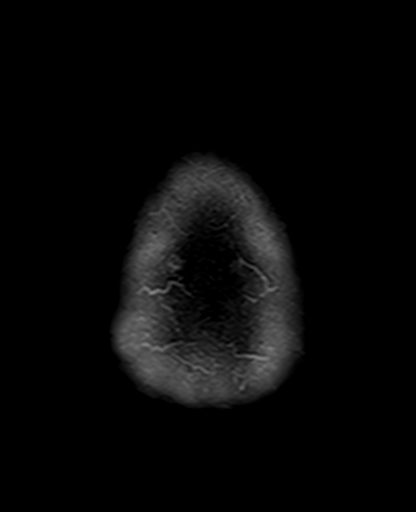

[Series 13: pha_images · axial · 3.0mm · 0.90mm/px · z∈[-80,+90]mm · 4 of 57 slices shown]
[im 1/57]
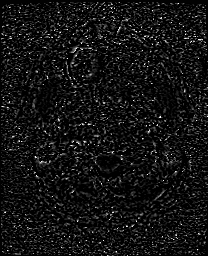
[im 19/57]
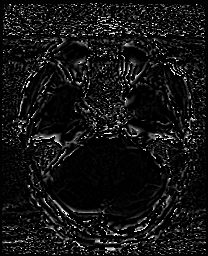
[im 38/57]
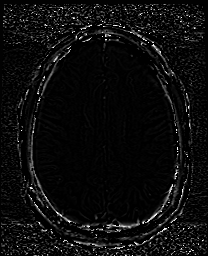
[im 57/57]
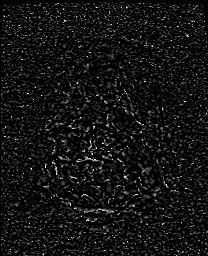

[Series 14: swi_images · axial · 3.0mm · 0.90mm/px · z∈[-80,+96]mm · 4 of 60 slices shown]
[im 1/60]
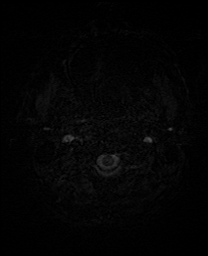
[im 20/60]
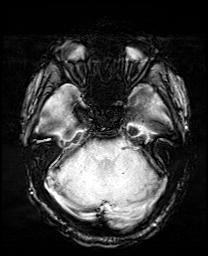
[im 40/60]
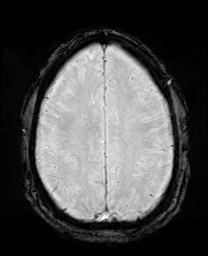
[im 60/60]
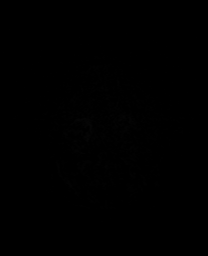

[Series 17: T2 · oblique · 3.0mm · 0.53mm/px · 2 of 32 slices shown (2 of 2)]
[im 1/32]
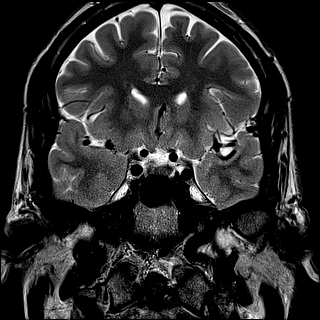
[im 32/32]
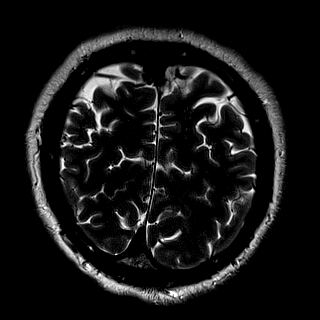

[Series 18: FLAIR · oblique · 3.0mm · 0.56mm/px · 2 of 32 slices shown (2 of 2)]
[im 1/32]
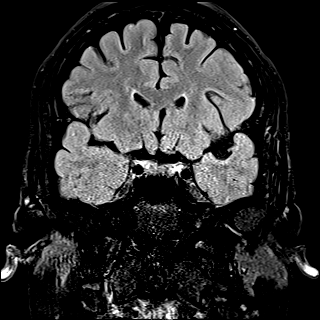
[im 32/32]
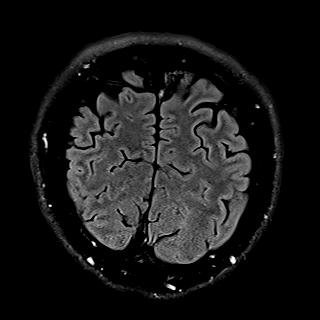

[Series 19: T2 post-contrast · coronal · 5.0mm · 0.72mm/px · 2 of 32 slices shown]
[im 1/32]
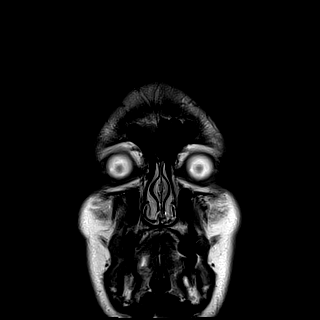
[im 32/32]
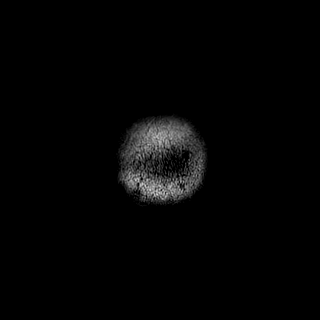

[Series 21: T1 post-contrast · coronal · 5.0mm · 0.34mm/px · 2 of 32 slices shown (1 of 2)]
[im 1/32]
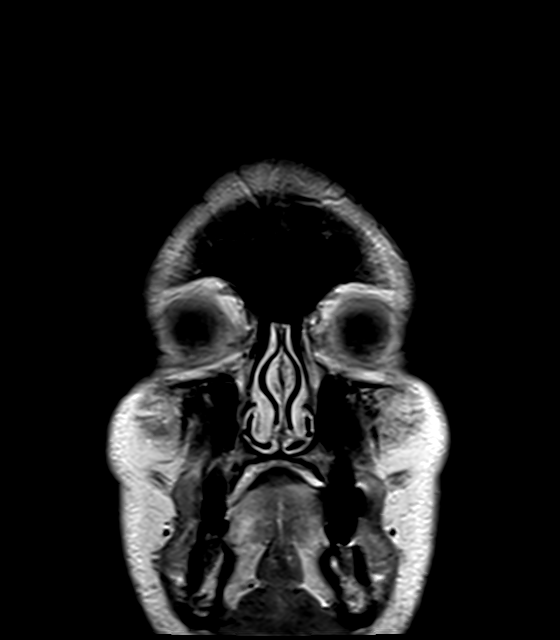
[im 32/32]
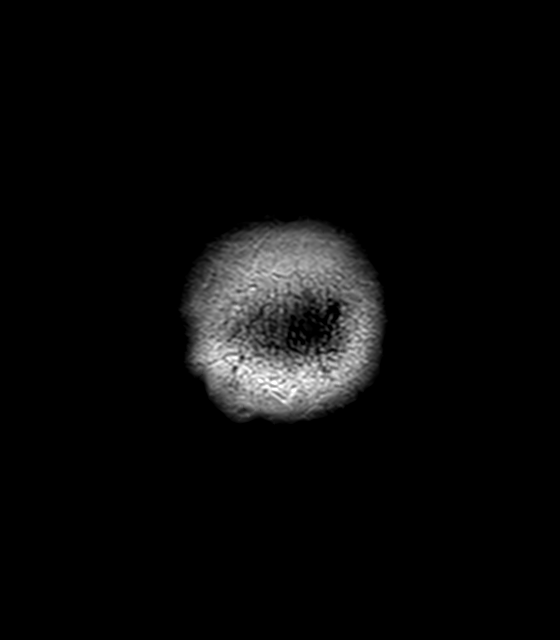

[Series 22: T1 post-contrast · sagittal · 5.0mm · 0.75mm/px · 2 of 25 slices shown (2 of 2)]
[im 1/25]
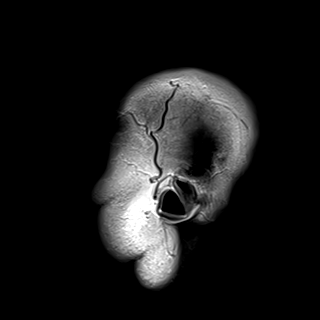
[im 25/25]
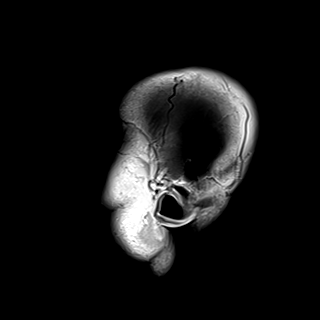

[40 of 48 positions shown; findings below may reference images not displayed]

FINDINGS: Brain: Cerebral volume within normal limits for patient age. Few
tiny foci of subcentimeter T2/FLAIR hyperintensity noted within the
periventricular white matter and deep white matter of the right
frontal corona radiata, nonspecific.

No abnormal foci of restricted diffusion to suggest acute or
subacute ischemia. Gray-white matter differentiation well
maintained. No encephalomalacia to suggest chronic infarction. No
foci of susceptibility artifact to suggest acute or chronic
intracranial hemorrhage.

No mass lesion, midline shift or mass effect. No hydrocephalus. No
extra-axial fluid collection. Major dural sinuses are grossly
patent.

Pituitary gland and suprasellar region are normal. Midline
structures intact and normal. No intrinsic temporal lobe
abnormality. No abnormal enhancement.

Vascular: Major intracranial vascular flow voids well maintained and
normal in appearance.

Skull and upper cervical spine: Craniocervical junction normal.
Visualized upper cervical spine within normal limits. Bone marrow
signal intensity normal. No scalp soft tissue abnormality.

Sinuses/Orbits: Globes and orbital soft tissues within normal
limits.

Mild mucosal thickening within the maxillary sinuses with
superimposed left maxillary sinus retention cyst. Paranasal sinuses
are otherwise clear. No mastoid effusion. Inner ear structures
normal.

Other: None.
IMPRESSION: 1. No acute intracranial abnormality.
2. Minimal cerebral white matter change for age, nonspecific, but
favored to reflect sequelae of early/mild microvascular ischemic
disease.

## 2023-11-05 DEATH — deceased
